# Patient Record
Sex: Female | Born: 1950 | Race: White | Hispanic: No | Smoking: Current every day smoker
Health system: Southern US, Community
[De-identification: ages and names within clinical notes are randomized; demographics above are authoritative.]

## PROBLEM LIST (undated history)

## (undated) DIAGNOSIS — J069 Acute upper respiratory infection, unspecified: Secondary | ICD-10-CM

## (undated) DIAGNOSIS — T8859XA Other complications of anesthesia, initial encounter: Secondary | ICD-10-CM

## (undated) DIAGNOSIS — I219 Acute myocardial infarction, unspecified: Secondary | ICD-10-CM

## (undated) DIAGNOSIS — G4730 Sleep apnea, unspecified: Secondary | ICD-10-CM

## (undated) DIAGNOSIS — K759 Inflammatory liver disease, unspecified: Secondary | ICD-10-CM

## (undated) DIAGNOSIS — M069 Rheumatoid arthritis, unspecified: Secondary | ICD-10-CM

## (undated) DIAGNOSIS — D649 Anemia, unspecified: Secondary | ICD-10-CM

## (undated) DIAGNOSIS — J189 Pneumonia, unspecified organism: Secondary | ICD-10-CM

## (undated) DIAGNOSIS — T4145XA Adverse effect of unspecified anesthetic, initial encounter: Secondary | ICD-10-CM

## (undated) DIAGNOSIS — J449 Chronic obstructive pulmonary disease, unspecified: Secondary | ICD-10-CM

## (undated) DIAGNOSIS — I2510 Atherosclerotic heart disease of native coronary artery without angina pectoris: Secondary | ICD-10-CM

## (undated) DIAGNOSIS — J42 Unspecified chronic bronchitis: Secondary | ICD-10-CM

## (undated) DIAGNOSIS — Z720 Tobacco use: Secondary | ICD-10-CM

## (undated) DIAGNOSIS — I4891 Unspecified atrial fibrillation: Secondary | ICD-10-CM

## (undated) DIAGNOSIS — E7800 Pure hypercholesterolemia, unspecified: Secondary | ICD-10-CM

## (undated) HISTORY — DX: Unspecified atrial fibrillation: I48.91

## (undated) HISTORY — PX: CORONARY ANGIOPLASTY: SHX604

## (undated) HISTORY — DX: Atherosclerotic heart disease of native coronary artery without angina pectoris: I25.10

## (undated) HISTORY — DX: Unspecified chronic bronchitis: J42

## (undated) HISTORY — PX: APPENDECTOMY: SHX54

## (undated) HISTORY — DX: Rheumatoid arthritis, unspecified: M06.9

## (undated) HISTORY — DX: Acute myocardial infarction, unspecified: I21.9

## (undated) HISTORY — DX: Acute upper respiratory infection, unspecified: J06.9

## (undated) HISTORY — PX: BREAST SURGERY: SHX581

## (undated) HISTORY — PX: ABDOMINAL HYSTERECTOMY: SHX81

## (undated) HISTORY — DX: Tobacco use: Z72.0

## (undated) HISTORY — PX: BACK SURGERY: SHX140

## (undated) HISTORY — DX: Pure hypercholesterolemia, unspecified: E78.00

## (undated) HISTORY — DX: Chronic obstructive pulmonary disease, unspecified: J44.9

---

## 1996-07-18 ENCOUNTER — Encounter (INDEPENDENT_AMBULATORY_CARE_PROVIDER_SITE_OTHER): Payer: Self-pay | Admitting: *Deleted

## 1997-11-07 ENCOUNTER — Encounter: Admission: RE | Admit: 1997-11-07 | Discharge: 1997-11-07 | Payer: Self-pay | Admitting: Family Medicine

## 1997-12-17 ENCOUNTER — Emergency Department (HOSPITAL_COMMUNITY): Admission: EM | Admit: 1997-12-17 | Discharge: 1997-12-17 | Payer: Self-pay | Admitting: Emergency Medicine

## 1997-12-24 ENCOUNTER — Emergency Department (HOSPITAL_COMMUNITY): Admission: EM | Admit: 1997-12-24 | Discharge: 1997-12-24 | Payer: Self-pay | Admitting: Emergency Medicine

## 1997-12-24 ENCOUNTER — Encounter: Admission: RE | Admit: 1997-12-24 | Discharge: 1997-12-24 | Payer: Self-pay | Admitting: Family Medicine

## 1998-01-21 ENCOUNTER — Emergency Department (HOSPITAL_COMMUNITY): Admission: EM | Admit: 1998-01-21 | Discharge: 1998-01-21 | Payer: Self-pay | Admitting: Emergency Medicine

## 1998-03-19 ENCOUNTER — Encounter: Admission: RE | Admit: 1998-03-19 | Discharge: 1998-03-19 | Payer: Self-pay | Admitting: Family Medicine

## 1998-04-03 ENCOUNTER — Encounter: Admission: RE | Admit: 1998-04-03 | Discharge: 1998-04-03 | Payer: Self-pay | Admitting: Family Medicine

## 1998-06-12 ENCOUNTER — Encounter: Payer: Self-pay | Admitting: Emergency Medicine

## 1998-06-12 ENCOUNTER — Emergency Department (HOSPITAL_COMMUNITY): Admission: EM | Admit: 1998-06-12 | Discharge: 1998-06-12 | Payer: Self-pay | Admitting: Emergency Medicine

## 1998-07-03 ENCOUNTER — Encounter: Admission: RE | Admit: 1998-07-03 | Discharge: 1998-07-03 | Payer: Self-pay | Admitting: Family Medicine

## 1998-08-13 ENCOUNTER — Encounter: Admission: RE | Admit: 1998-08-13 | Discharge: 1998-08-13 | Payer: Self-pay | Admitting: Family Medicine

## 1998-09-07 ENCOUNTER — Encounter: Admission: RE | Admit: 1998-09-07 | Discharge: 1998-09-07 | Payer: Self-pay | Admitting: Family Medicine

## 1998-10-15 ENCOUNTER — Encounter: Admission: RE | Admit: 1998-10-15 | Discharge: 1998-10-15 | Payer: Self-pay | Admitting: Family Medicine

## 1998-11-08 ENCOUNTER — Emergency Department (HOSPITAL_COMMUNITY): Admission: EM | Admit: 1998-11-08 | Discharge: 1998-11-08 | Payer: Self-pay | Admitting: *Deleted

## 1998-12-04 ENCOUNTER — Encounter: Admission: RE | Admit: 1998-12-04 | Discharge: 1998-12-04 | Payer: Self-pay | Admitting: Family Medicine

## 1998-12-07 ENCOUNTER — Encounter: Admission: RE | Admit: 1998-12-07 | Discharge: 1998-12-07 | Payer: Self-pay | Admitting: Family Medicine

## 1998-12-15 ENCOUNTER — Encounter: Admission: RE | Admit: 1998-12-15 | Discharge: 1998-12-15 | Payer: Self-pay | Admitting: *Deleted

## 1999-01-11 ENCOUNTER — Encounter: Admission: RE | Admit: 1999-01-11 | Discharge: 1999-01-11 | Payer: Self-pay | Admitting: Family Medicine

## 1999-01-27 ENCOUNTER — Encounter: Admission: RE | Admit: 1999-01-27 | Discharge: 1999-01-27 | Payer: Self-pay | Admitting: Family Medicine

## 1999-02-04 ENCOUNTER — Encounter: Admission: RE | Admit: 1999-02-04 | Discharge: 1999-02-04 | Payer: Self-pay | Admitting: Family Medicine

## 1999-02-08 ENCOUNTER — Encounter: Admission: RE | Admit: 1999-02-08 | Discharge: 1999-02-08 | Payer: Self-pay | Admitting: Family Medicine

## 1999-03-01 ENCOUNTER — Encounter: Admission: RE | Admit: 1999-03-01 | Discharge: 1999-03-01 | Payer: Self-pay | Admitting: Family Medicine

## 1999-03-10 ENCOUNTER — Encounter: Admission: RE | Admit: 1999-03-10 | Discharge: 1999-03-10 | Payer: Self-pay | Admitting: Family Medicine

## 1999-04-27 ENCOUNTER — Encounter: Admission: RE | Admit: 1999-04-27 | Discharge: 1999-04-27 | Payer: Self-pay | Admitting: Family Medicine

## 1999-04-28 ENCOUNTER — Encounter: Admission: RE | Admit: 1999-04-28 | Discharge: 1999-04-28 | Payer: Self-pay | Admitting: Family Medicine

## 1999-04-30 ENCOUNTER — Encounter: Admission: RE | Admit: 1999-04-30 | Discharge: 1999-04-30 | Payer: Self-pay | Admitting: Sports Medicine

## 1999-05-21 ENCOUNTER — Encounter: Admission: RE | Admit: 1999-05-21 | Discharge: 1999-05-21 | Payer: Self-pay | Admitting: Family Medicine

## 1999-05-24 ENCOUNTER — Encounter: Admission: RE | Admit: 1999-05-24 | Discharge: 1999-05-24 | Payer: Self-pay | Admitting: Sports Medicine

## 1999-05-24 ENCOUNTER — Encounter: Payer: Self-pay | Admitting: Sports Medicine

## 1999-06-02 ENCOUNTER — Encounter: Admission: RE | Admit: 1999-06-02 | Discharge: 1999-06-02 | Payer: Self-pay | Admitting: Family Medicine

## 1999-07-05 ENCOUNTER — Encounter: Admission: RE | Admit: 1999-07-05 | Discharge: 1999-07-05 | Payer: Self-pay | Admitting: Family Medicine

## 1999-07-10 ENCOUNTER — Emergency Department (HOSPITAL_COMMUNITY): Admission: EM | Admit: 1999-07-10 | Discharge: 1999-07-10 | Payer: Self-pay | Admitting: Emergency Medicine

## 1999-08-25 ENCOUNTER — Encounter: Admission: RE | Admit: 1999-08-25 | Discharge: 1999-08-25 | Payer: Self-pay | Admitting: Family Medicine

## 1999-09-22 ENCOUNTER — Encounter: Admission: RE | Admit: 1999-09-22 | Discharge: 1999-09-22 | Payer: Self-pay | Admitting: Family Medicine

## 1999-12-24 ENCOUNTER — Encounter: Admission: RE | Admit: 1999-12-24 | Discharge: 1999-12-24 | Payer: Self-pay | Admitting: Family Medicine

## 2000-01-05 ENCOUNTER — Encounter: Admission: RE | Admit: 2000-01-05 | Discharge: 2000-01-05 | Payer: Self-pay | Admitting: Family Medicine

## 2000-03-12 ENCOUNTER — Encounter: Payer: Self-pay | Admitting: *Deleted

## 2000-03-12 ENCOUNTER — Inpatient Hospital Stay (HOSPITAL_COMMUNITY): Admission: EM | Admit: 2000-03-12 | Discharge: 2000-03-15 | Payer: Self-pay | Admitting: *Deleted

## 2000-03-13 ENCOUNTER — Encounter: Payer: Self-pay | Admitting: *Deleted

## 2000-03-14 ENCOUNTER — Encounter: Payer: Self-pay | Admitting: Cardiology

## 2000-04-18 ENCOUNTER — Encounter: Admission: RE | Admit: 2000-04-18 | Discharge: 2000-04-18 | Payer: Self-pay | Admitting: Family Medicine

## 2000-05-03 ENCOUNTER — Encounter: Admission: RE | Admit: 2000-05-03 | Discharge: 2000-05-03 | Payer: Self-pay | Admitting: Family Medicine

## 2000-06-03 ENCOUNTER — Emergency Department (HOSPITAL_COMMUNITY): Admission: EM | Admit: 2000-06-03 | Discharge: 2000-06-03 | Payer: Self-pay | Admitting: Emergency Medicine

## 2000-06-03 ENCOUNTER — Encounter: Payer: Self-pay | Admitting: Emergency Medicine

## 2000-06-06 ENCOUNTER — Encounter: Admission: RE | Admit: 2000-06-06 | Discharge: 2000-06-06 | Payer: Self-pay | Admitting: Family Medicine

## 2000-08-28 ENCOUNTER — Encounter: Admission: RE | Admit: 2000-08-28 | Discharge: 2000-08-28 | Payer: Self-pay | Admitting: Family Medicine

## 2000-09-05 ENCOUNTER — Encounter: Admission: RE | Admit: 2000-09-05 | Discharge: 2000-09-05 | Payer: Self-pay | Admitting: Sports Medicine

## 2000-09-11 ENCOUNTER — Encounter: Admission: RE | Admit: 2000-09-11 | Discharge: 2000-09-11 | Payer: Self-pay | Admitting: Family Medicine

## 2000-09-15 ENCOUNTER — Encounter: Admission: RE | Admit: 2000-09-15 | Discharge: 2000-09-15 | Payer: Self-pay | Admitting: *Deleted

## 2000-10-11 ENCOUNTER — Encounter: Admission: RE | Admit: 2000-10-11 | Discharge: 2000-10-11 | Payer: Self-pay | Admitting: Family Medicine

## 2000-11-23 ENCOUNTER — Encounter: Admission: RE | Admit: 2000-11-23 | Discharge: 2000-11-23 | Payer: Self-pay | Admitting: Family Medicine

## 2000-12-19 ENCOUNTER — Emergency Department (HOSPITAL_COMMUNITY): Admission: EM | Admit: 2000-12-19 | Discharge: 2000-12-19 | Payer: Self-pay | Admitting: *Deleted

## 2001-02-28 ENCOUNTER — Encounter: Admission: RE | Admit: 2001-02-28 | Discharge: 2001-02-28 | Payer: Self-pay | Admitting: Family Medicine

## 2001-03-23 ENCOUNTER — Emergency Department (HOSPITAL_COMMUNITY): Admission: EM | Admit: 2001-03-23 | Discharge: 2001-03-24 | Payer: Self-pay | Admitting: Emergency Medicine

## 2001-04-05 ENCOUNTER — Encounter: Admission: RE | Admit: 2001-04-05 | Discharge: 2001-04-05 | Payer: Self-pay | Admitting: Family Medicine

## 2001-04-10 ENCOUNTER — Emergency Department (HOSPITAL_COMMUNITY): Admission: EM | Admit: 2001-04-10 | Discharge: 2001-04-11 | Payer: Self-pay | Admitting: Emergency Medicine

## 2001-04-10 ENCOUNTER — Encounter: Payer: Self-pay | Admitting: Emergency Medicine

## 2001-09-24 ENCOUNTER — Encounter: Admission: RE | Admit: 2001-09-24 | Discharge: 2001-09-24 | Payer: Self-pay | Admitting: Family Medicine

## 2001-11-08 ENCOUNTER — Encounter: Admission: RE | Admit: 2001-11-08 | Discharge: 2001-11-08 | Payer: Self-pay | Admitting: Sports Medicine

## 2002-04-05 ENCOUNTER — Encounter: Admission: RE | Admit: 2002-04-05 | Discharge: 2002-04-05 | Payer: Self-pay | Admitting: Family Medicine

## 2002-04-26 ENCOUNTER — Encounter: Admission: RE | Admit: 2002-04-26 | Discharge: 2002-04-26 | Payer: Self-pay | Admitting: Family Medicine

## 2002-05-03 ENCOUNTER — Emergency Department (HOSPITAL_COMMUNITY): Admission: EM | Admit: 2002-05-03 | Discharge: 2002-05-03 | Payer: Self-pay | Admitting: Emergency Medicine

## 2002-12-22 ENCOUNTER — Emergency Department (HOSPITAL_COMMUNITY): Admission: EM | Admit: 2002-12-22 | Discharge: 2002-12-22 | Payer: Self-pay | Admitting: Emergency Medicine

## 2002-12-23 ENCOUNTER — Encounter: Admission: RE | Admit: 2002-12-23 | Discharge: 2002-12-23 | Payer: Self-pay | Admitting: Sports Medicine

## 2003-03-10 ENCOUNTER — Emergency Department (HOSPITAL_COMMUNITY): Admission: EM | Admit: 2003-03-10 | Discharge: 2003-03-10 | Payer: Self-pay | Admitting: Emergency Medicine

## 2003-05-07 ENCOUNTER — Encounter: Admission: RE | Admit: 2003-05-07 | Discharge: 2003-05-07 | Payer: Self-pay | Admitting: Family Medicine

## 2003-06-18 ENCOUNTER — Encounter: Admission: RE | Admit: 2003-06-18 | Discharge: 2003-06-18 | Payer: Self-pay | Admitting: Family Medicine

## 2003-07-22 ENCOUNTER — Emergency Department (HOSPITAL_COMMUNITY): Admission: EM | Admit: 2003-07-22 | Discharge: 2003-07-22 | Payer: Self-pay | Admitting: Emergency Medicine

## 2003-08-20 ENCOUNTER — Emergency Department (HOSPITAL_COMMUNITY): Admission: EM | Admit: 2003-08-20 | Discharge: 2003-08-20 | Payer: Self-pay | Admitting: Emergency Medicine

## 2004-02-02 ENCOUNTER — Encounter: Admission: RE | Admit: 2004-02-02 | Discharge: 2004-02-02 | Payer: Self-pay | Admitting: Family Medicine

## 2004-04-23 ENCOUNTER — Ambulatory Visit: Payer: Self-pay | Admitting: Sports Medicine

## 2004-04-27 ENCOUNTER — Ambulatory Visit: Payer: Self-pay | Admitting: Family Medicine

## 2004-04-27 ENCOUNTER — Observation Stay (HOSPITAL_COMMUNITY): Admission: EM | Admit: 2004-04-27 | Discharge: 2004-04-28 | Payer: Self-pay | Admitting: Family Medicine

## 2004-05-03 ENCOUNTER — Ambulatory Visit: Payer: Self-pay | Admitting: Sports Medicine

## 2004-06-19 ENCOUNTER — Emergency Department (HOSPITAL_COMMUNITY): Admission: EM | Admit: 2004-06-19 | Discharge: 2004-06-19 | Payer: Self-pay | Admitting: Emergency Medicine

## 2004-07-18 DIAGNOSIS — I219 Acute myocardial infarction, unspecified: Secondary | ICD-10-CM

## 2004-07-18 HISTORY — DX: Acute myocardial infarction, unspecified: I21.9

## 2004-08-08 ENCOUNTER — Emergency Department (HOSPITAL_COMMUNITY): Admission: EM | Admit: 2004-08-08 | Discharge: 2004-08-08 | Payer: Self-pay | Admitting: Emergency Medicine

## 2004-09-14 ENCOUNTER — Ambulatory Visit: Payer: Self-pay | Admitting: Sports Medicine

## 2004-09-29 ENCOUNTER — Ambulatory Visit: Payer: Self-pay | Admitting: Family Medicine

## 2004-10-10 ENCOUNTER — Emergency Department (HOSPITAL_COMMUNITY): Admission: EM | Admit: 2004-10-10 | Discharge: 2004-10-10 | Payer: Self-pay | Admitting: Emergency Medicine

## 2004-10-30 ENCOUNTER — Emergency Department (HOSPITAL_COMMUNITY): Admission: EM | Admit: 2004-10-30 | Discharge: 2004-10-30 | Payer: Self-pay | Admitting: Emergency Medicine

## 2004-11-05 ENCOUNTER — Emergency Department (HOSPITAL_COMMUNITY): Admission: EM | Admit: 2004-11-05 | Discharge: 2004-11-05 | Payer: Self-pay | Admitting: Emergency Medicine

## 2004-11-08 ENCOUNTER — Encounter: Admission: RE | Admit: 2004-11-08 | Discharge: 2004-11-08 | Payer: Self-pay | Admitting: Family Medicine

## 2004-11-08 ENCOUNTER — Ambulatory Visit: Payer: Self-pay | Admitting: Family Medicine

## 2004-11-17 ENCOUNTER — Ambulatory Visit: Payer: Self-pay | Admitting: Family Medicine

## 2004-11-23 ENCOUNTER — Ambulatory Visit: Payer: Self-pay | Admitting: Sports Medicine

## 2004-11-29 ENCOUNTER — Ambulatory Visit: Payer: Self-pay | Admitting: Sports Medicine

## 2004-11-29 ENCOUNTER — Ambulatory Visit (HOSPITAL_COMMUNITY): Admission: RE | Admit: 2004-11-29 | Discharge: 2004-11-29 | Payer: Self-pay | Admitting: Sports Medicine

## 2004-11-29 ENCOUNTER — Ambulatory Visit: Payer: Self-pay | Admitting: Family Medicine

## 2004-11-29 ENCOUNTER — Inpatient Hospital Stay (HOSPITAL_COMMUNITY): Admission: AD | Admit: 2004-11-29 | Discharge: 2004-12-01 | Payer: Self-pay | Admitting: Sports Medicine

## 2004-11-30 ENCOUNTER — Encounter (INDEPENDENT_AMBULATORY_CARE_PROVIDER_SITE_OTHER): Payer: Self-pay | Admitting: *Deleted

## 2004-12-06 ENCOUNTER — Ambulatory Visit: Payer: Self-pay | Admitting: Family Medicine

## 2004-12-14 ENCOUNTER — Encounter: Payer: Self-pay | Admitting: Interventional Radiology

## 2004-12-16 ENCOUNTER — Ambulatory Visit: Payer: Self-pay | Admitting: Family Medicine

## 2004-12-21 ENCOUNTER — Encounter: Payer: Self-pay | Admitting: Interventional Radiology

## 2005-01-04 ENCOUNTER — Emergency Department (HOSPITAL_COMMUNITY): Admission: EM | Admit: 2005-01-04 | Discharge: 2005-01-04 | Payer: Self-pay | Admitting: Emergency Medicine

## 2005-01-24 ENCOUNTER — Ambulatory Visit: Payer: Self-pay | Admitting: Sports Medicine

## 2005-01-31 ENCOUNTER — Encounter: Payer: Self-pay | Admitting: Interventional Radiology

## 2005-02-03 ENCOUNTER — Ambulatory Visit (HOSPITAL_COMMUNITY): Admission: RE | Admit: 2005-02-03 | Discharge: 2005-02-03 | Payer: Self-pay | Admitting: Interventional Radiology

## 2005-02-11 ENCOUNTER — Ambulatory Visit (HOSPITAL_COMMUNITY): Admission: RE | Admit: 2005-02-11 | Discharge: 2005-02-11 | Payer: Self-pay | Admitting: Interventional Radiology

## 2005-02-11 ENCOUNTER — Encounter (INDEPENDENT_AMBULATORY_CARE_PROVIDER_SITE_OTHER): Payer: Self-pay | Admitting: *Deleted

## 2005-02-21 ENCOUNTER — Ambulatory Visit: Payer: Self-pay | Admitting: Sports Medicine

## 2005-02-25 ENCOUNTER — Ambulatory Visit: Payer: Self-pay | Admitting: Family Medicine

## 2005-02-25 ENCOUNTER — Encounter: Payer: Self-pay | Admitting: Interventional Radiology

## 2005-03-27 ENCOUNTER — Emergency Department (HOSPITAL_COMMUNITY): Admission: EM | Admit: 2005-03-27 | Discharge: 2005-03-27 | Payer: Self-pay | Admitting: Emergency Medicine

## 2005-03-29 ENCOUNTER — Inpatient Hospital Stay (HOSPITAL_COMMUNITY): Admission: EM | Admit: 2005-03-29 | Discharge: 2005-04-03 | Payer: Self-pay | Admitting: Emergency Medicine

## 2005-03-29 ENCOUNTER — Ambulatory Visit: Payer: Self-pay | Admitting: Sports Medicine

## 2005-03-30 ENCOUNTER — Encounter (INDEPENDENT_AMBULATORY_CARE_PROVIDER_SITE_OTHER): Payer: Self-pay | Admitting: Cardiology

## 2005-04-15 ENCOUNTER — Ambulatory Visit: Payer: Self-pay | Admitting: Family Medicine

## 2005-04-19 ENCOUNTER — Ambulatory Visit (HOSPITAL_COMMUNITY): Admission: RE | Admit: 2005-04-19 | Discharge: 2005-04-19 | Payer: Self-pay | Admitting: Family Medicine

## 2005-05-02 ENCOUNTER — Emergency Department (HOSPITAL_COMMUNITY): Admission: EM | Admit: 2005-05-02 | Discharge: 2005-05-02 | Payer: Self-pay | Admitting: Emergency Medicine

## 2005-05-03 ENCOUNTER — Ambulatory Visit: Payer: Self-pay | Admitting: Family Medicine

## 2005-05-11 ENCOUNTER — Emergency Department (HOSPITAL_COMMUNITY): Admission: EM | Admit: 2005-05-11 | Discharge: 2005-05-12 | Payer: Self-pay | Admitting: Emergency Medicine

## 2005-06-06 ENCOUNTER — Ambulatory Visit: Payer: Self-pay | Admitting: Family Medicine

## 2005-06-29 ENCOUNTER — Ambulatory Visit: Payer: Self-pay | Admitting: Family Medicine

## 2005-07-01 ENCOUNTER — Encounter: Admission: RE | Admit: 2005-07-01 | Discharge: 2005-07-01 | Payer: Self-pay | Admitting: Family Medicine

## 2005-07-18 HISTORY — PX: OTHER SURGICAL HISTORY: SHX169

## 2005-07-22 ENCOUNTER — Ambulatory Visit: Payer: Self-pay | Admitting: Family Medicine

## 2005-08-17 ENCOUNTER — Ambulatory Visit: Payer: Self-pay | Admitting: Family Medicine

## 2005-08-24 ENCOUNTER — Ambulatory Visit: Payer: Self-pay | Admitting: Family Medicine

## 2005-08-26 ENCOUNTER — Ambulatory Visit: Payer: Self-pay | Admitting: Physical Medicine and Rehabilitation

## 2005-08-26 ENCOUNTER — Encounter
Admission: RE | Admit: 2005-08-26 | Discharge: 2005-11-24 | Payer: Self-pay | Admitting: Physical Medicine and Rehabilitation

## 2005-09-29 ENCOUNTER — Ambulatory Visit: Payer: Self-pay | Admitting: Family Medicine

## 2005-09-29 ENCOUNTER — Ambulatory Visit (HOSPITAL_COMMUNITY)
Admission: RE | Admit: 2005-09-29 | Discharge: 2005-09-29 | Payer: Self-pay | Admitting: Physical Medicine and Rehabilitation

## 2005-10-19 ENCOUNTER — Ambulatory Visit: Payer: Self-pay | Admitting: Physical Medicine and Rehabilitation

## 2005-11-18 ENCOUNTER — Encounter
Admission: RE | Admit: 2005-11-18 | Discharge: 2006-02-16 | Payer: Self-pay | Admitting: Physical Medicine and Rehabilitation

## 2005-12-14 ENCOUNTER — Ambulatory Visit: Payer: Self-pay | Admitting: Family Medicine

## 2005-12-15 ENCOUNTER — Encounter
Admission: RE | Admit: 2005-12-15 | Discharge: 2005-12-15 | Payer: Self-pay | Admitting: Physical Medicine and Rehabilitation

## 2005-12-16 ENCOUNTER — Ambulatory Visit: Payer: Self-pay | Admitting: Physical Medicine and Rehabilitation

## 2006-02-10 ENCOUNTER — Encounter
Admission: RE | Admit: 2006-02-10 | Discharge: 2006-05-11 | Payer: Self-pay | Admitting: Physical Medicine and Rehabilitation

## 2006-02-10 ENCOUNTER — Ambulatory Visit: Payer: Self-pay | Admitting: Physical Medicine and Rehabilitation

## 2006-03-15 ENCOUNTER — Ambulatory Visit: Payer: Self-pay | Admitting: Family Medicine

## 2006-04-06 ENCOUNTER — Ambulatory Visit: Payer: Self-pay | Admitting: Physical Medicine & Rehabilitation

## 2006-05-05 ENCOUNTER — Ambulatory Visit: Payer: Self-pay | Admitting: Physical Medicine and Rehabilitation

## 2006-05-13 ENCOUNTER — Emergency Department (HOSPITAL_COMMUNITY): Admission: EM | Admit: 2006-05-13 | Discharge: 2006-05-13 | Payer: Self-pay | Admitting: Emergency Medicine

## 2006-06-02 ENCOUNTER — Encounter
Admission: RE | Admit: 2006-06-02 | Discharge: 2006-08-31 | Payer: Self-pay | Admitting: Physical Medicine and Rehabilitation

## 2006-06-28 ENCOUNTER — Ambulatory Visit: Payer: Self-pay | Admitting: Physical Medicine and Rehabilitation

## 2006-08-01 ENCOUNTER — Ambulatory Visit: Payer: Self-pay | Admitting: Physical Medicine and Rehabilitation

## 2006-08-29 ENCOUNTER — Encounter
Admission: RE | Admit: 2006-08-29 | Discharge: 2006-11-27 | Payer: Self-pay | Admitting: Physical Medicine and Rehabilitation

## 2006-08-29 ENCOUNTER — Ambulatory Visit: Payer: Self-pay | Admitting: Physical Medicine and Rehabilitation

## 2006-09-14 DIAGNOSIS — K219 Gastro-esophageal reflux disease without esophagitis: Secondary | ICD-10-CM

## 2006-09-14 DIAGNOSIS — IMO0002 Reserved for concepts with insufficient information to code with codable children: Secondary | ICD-10-CM | POA: Insufficient documentation

## 2006-09-14 DIAGNOSIS — I2510 Atherosclerotic heart disease of native coronary artery without angina pectoris: Secondary | ICD-10-CM | POA: Insufficient documentation

## 2006-09-14 DIAGNOSIS — M810 Age-related osteoporosis without current pathological fracture: Secondary | ICD-10-CM | POA: Insufficient documentation

## 2006-09-14 DIAGNOSIS — E1165 Type 2 diabetes mellitus with hyperglycemia: Secondary | ICD-10-CM

## 2006-09-14 DIAGNOSIS — E7800 Pure hypercholesterolemia, unspecified: Secondary | ICD-10-CM | POA: Insufficient documentation

## 2006-09-14 DIAGNOSIS — M1990 Unspecified osteoarthritis, unspecified site: Secondary | ICD-10-CM

## 2006-09-14 DIAGNOSIS — I4891 Unspecified atrial fibrillation: Secondary | ICD-10-CM | POA: Insufficient documentation

## 2006-09-15 ENCOUNTER — Encounter (INDEPENDENT_AMBULATORY_CARE_PROVIDER_SITE_OTHER): Payer: Self-pay | Admitting: *Deleted

## 2006-10-17 ENCOUNTER — Encounter (INDEPENDENT_AMBULATORY_CARE_PROVIDER_SITE_OTHER): Payer: Self-pay | Admitting: Family Medicine

## 2006-10-17 ENCOUNTER — Ambulatory Visit: Payer: Self-pay | Admitting: Sports Medicine

## 2006-10-17 DIAGNOSIS — E785 Hyperlipidemia, unspecified: Secondary | ICD-10-CM

## 2006-10-17 DIAGNOSIS — M069 Rheumatoid arthritis, unspecified: Secondary | ICD-10-CM | POA: Insufficient documentation

## 2006-10-17 DIAGNOSIS — E1165 Type 2 diabetes mellitus with hyperglycemia: Secondary | ICD-10-CM

## 2006-10-17 DIAGNOSIS — I10 Essential (primary) hypertension: Secondary | ICD-10-CM | POA: Insufficient documentation

## 2006-10-17 DIAGNOSIS — F17200 Nicotine dependence, unspecified, uncomplicated: Secondary | ICD-10-CM

## 2006-10-17 LAB — CONVERTED CEMR LAB
ALT: 24 units/L (ref 0–35)
AST: 23 units/L (ref 0–37)
Albumin: 4.1 g/dL (ref 3.5–5.2)
BUN: 7 mg/dL (ref 6–23)
CO2: 23 meq/L (ref 19–32)
Calcium: 9.4 mg/dL (ref 8.4–10.5)
Chloride: 98 meq/L (ref 96–112)
Hgb A1c MFr Bld: 13.1 %
Phosphorus: 3.7 mg/dL (ref 2.3–4.6)
Potassium: 4 meq/L (ref 3.5–5.3)

## 2006-10-20 ENCOUNTER — Ambulatory Visit: Payer: Self-pay | Admitting: Physical Medicine and Rehabilitation

## 2006-10-24 ENCOUNTER — Telehealth (INDEPENDENT_AMBULATORY_CARE_PROVIDER_SITE_OTHER): Payer: Self-pay | Admitting: *Deleted

## 2006-10-27 ENCOUNTER — Telehealth (INDEPENDENT_AMBULATORY_CARE_PROVIDER_SITE_OTHER): Payer: Self-pay | Admitting: Family Medicine

## 2006-11-03 ENCOUNTER — Encounter: Payer: Self-pay | Admitting: *Deleted

## 2006-11-20 ENCOUNTER — Ambulatory Visit: Payer: Self-pay | Admitting: Physical Medicine and Rehabilitation

## 2006-12-20 ENCOUNTER — Encounter
Admission: RE | Admit: 2006-12-20 | Discharge: 2007-03-20 | Payer: Self-pay | Admitting: Physical Medicine and Rehabilitation

## 2006-12-20 ENCOUNTER — Ambulatory Visit: Payer: Self-pay | Admitting: Physical Medicine and Rehabilitation

## 2007-01-22 ENCOUNTER — Ambulatory Visit: Payer: Self-pay | Admitting: Physical Medicine and Rehabilitation

## 2007-02-12 ENCOUNTER — Telehealth (INDEPENDENT_AMBULATORY_CARE_PROVIDER_SITE_OTHER): Payer: Self-pay | Admitting: Family Medicine

## 2007-02-13 ENCOUNTER — Telehealth: Payer: Self-pay | Admitting: *Deleted

## 2007-02-13 ENCOUNTER — Ambulatory Visit: Payer: Self-pay | Admitting: Family Medicine

## 2007-02-13 LAB — CONVERTED CEMR LAB
Bilirubin Urine: NEGATIVE
Ketones, urine, test strip: NEGATIVE
Nitrite: NEGATIVE
Urobilinogen, UA: 0.2

## 2007-02-20 ENCOUNTER — Ambulatory Visit: Payer: Self-pay | Admitting: Physical Medicine and Rehabilitation

## 2007-03-19 IMAGING — CR DG LUMBAR SPINE COMPLETE 4+V
5 series · 5 of 5 positions shown · non-contrast
Comparison: None.

CLINICAL DATA: Two week history of pain.  
 FOUR VIEW LUMBAR SPINE, COMPLETE ? 11/08/04:

[view not recorded (1 of 5)]
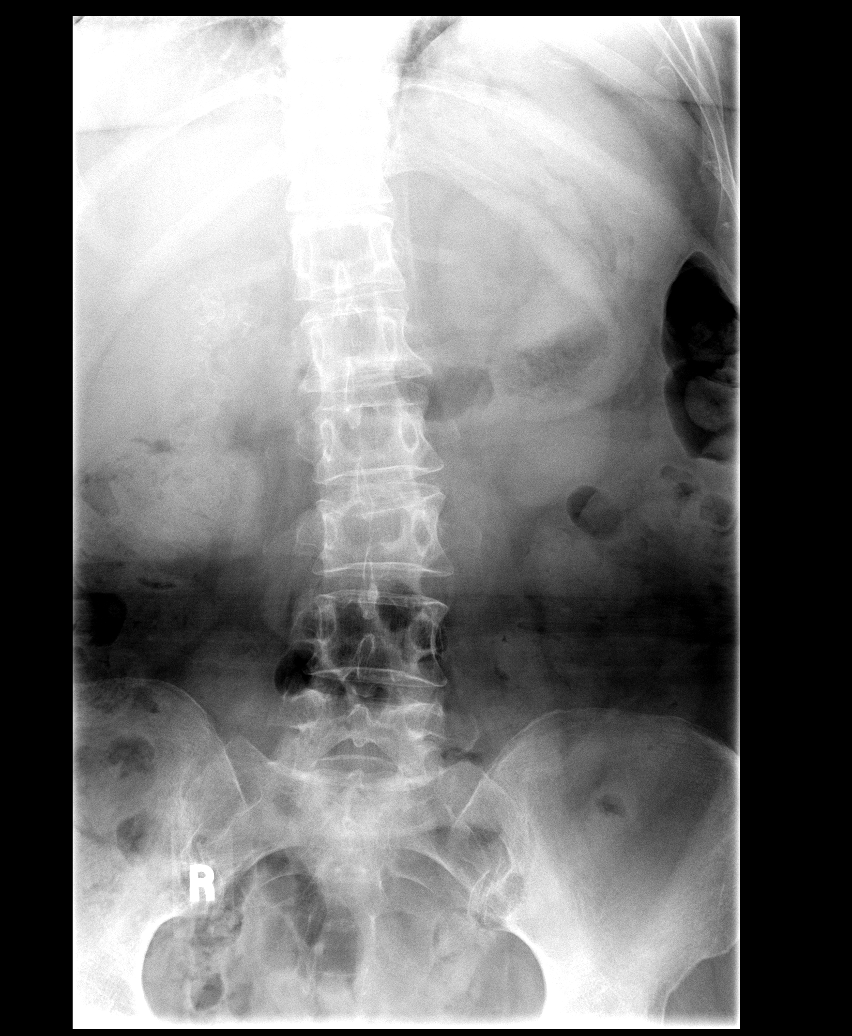

[view not recorded (2 of 5)]
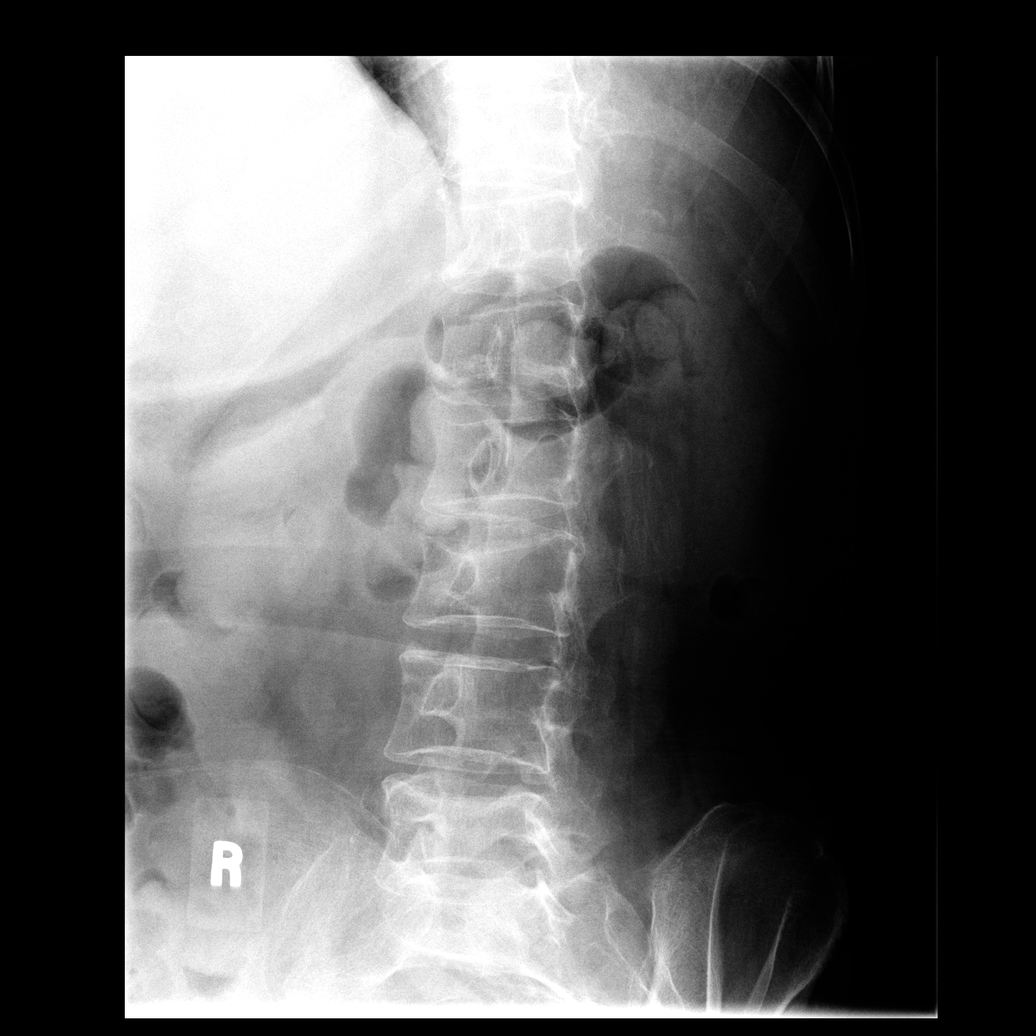

[view not recorded (3 of 5)]
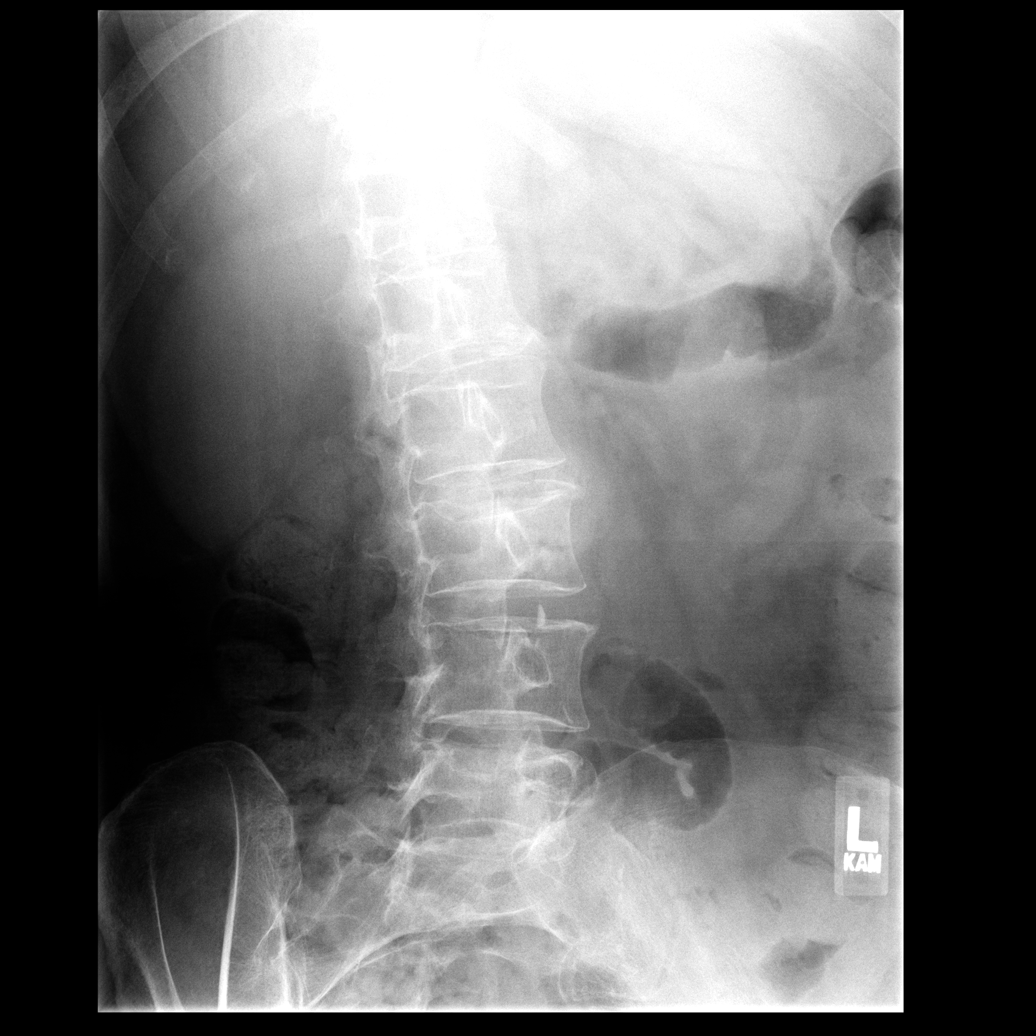

[view not recorded (4 of 5)]
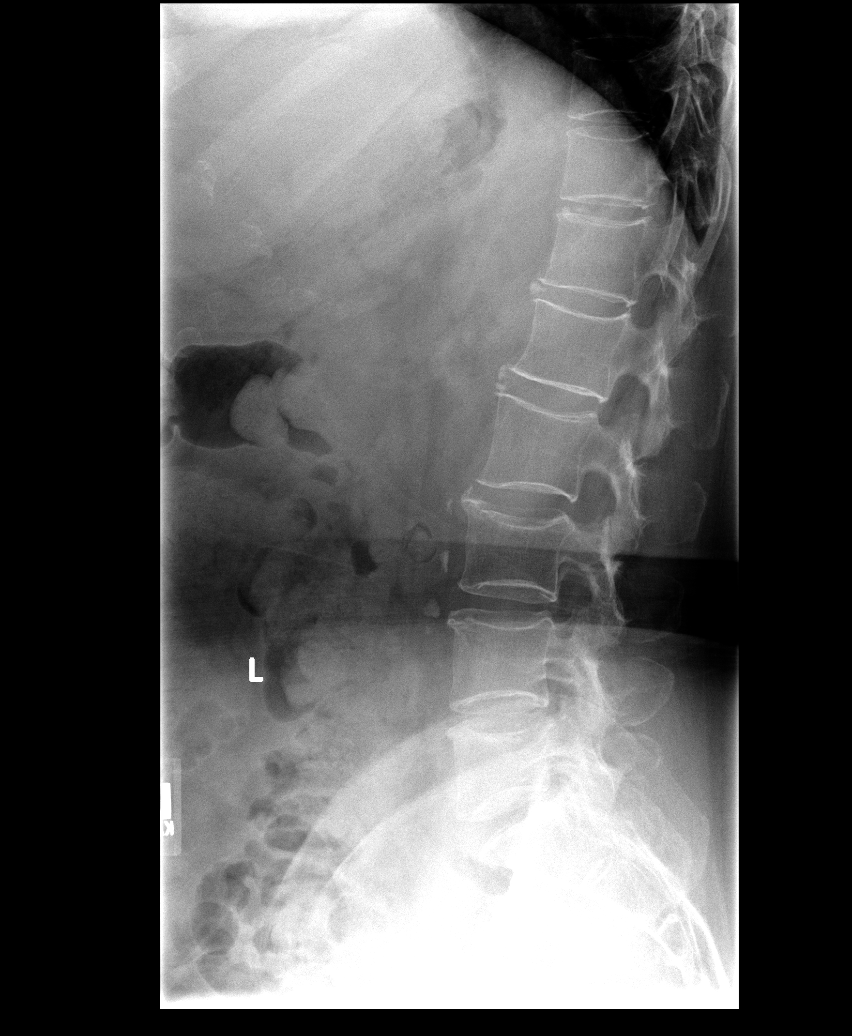

[view not recorded (5 of 5)]
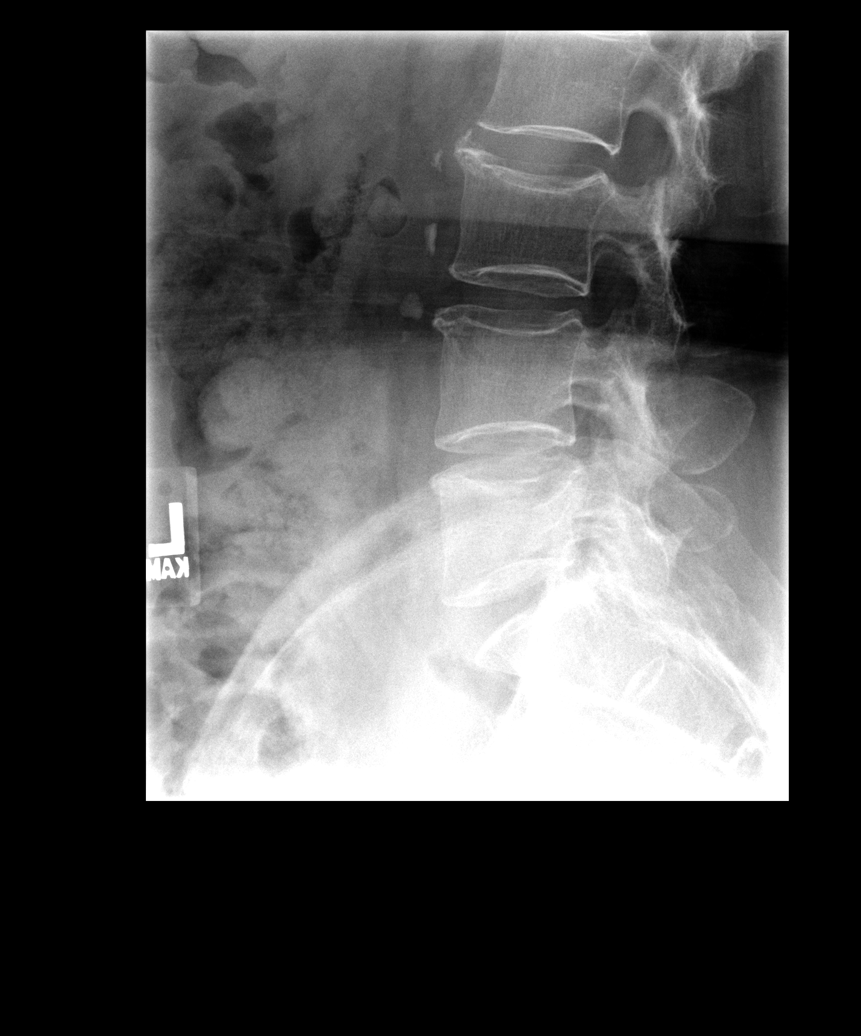

[5 of 5 positions shown; findings below may reference images not displayed]

Five non-rib-bearing lumbar vertebral bodies with mild convex leftward scoliosis.  There is superior end plate compression at the L3 level without substantial loss of height (less than 25%).  No retropulsed fragments apparent by plain film exam.  
 Loss of disk height noted at L4-5.  The facets are well aligned bilaterally.
 Incidental note of multiple calcified gallstones.
IMPRESSION: 1.  L3 superior end plate fracture.  No evidence for retropulsed fragments by plain film.  A CT would be a more sensitive means to investigate if clinically indicated.
 2.  Cholelithiasis.

## 2007-03-21 ENCOUNTER — Encounter
Admission: RE | Admit: 2007-03-21 | Discharge: 2007-06-19 | Payer: Self-pay | Admitting: Physical Medicine and Rehabilitation

## 2007-03-21 ENCOUNTER — Encounter (INDEPENDENT_AMBULATORY_CARE_PROVIDER_SITE_OTHER): Payer: Self-pay | Admitting: *Deleted

## 2007-04-12 ENCOUNTER — Telehealth: Payer: Self-pay | Admitting: Family Medicine

## 2007-04-12 ENCOUNTER — Encounter: Payer: Self-pay | Admitting: Family Medicine

## 2007-04-12 ENCOUNTER — Ambulatory Visit: Payer: Self-pay | Admitting: Family Medicine

## 2007-04-12 LAB — CONVERTED CEMR LAB
ALT: 15 units/L (ref 0–35)
AST: 14 units/L (ref 0–37)
Albumin: 3.7 g/dL (ref 3.5–5.2)
Alkaline Phosphatase: 84 units/L (ref 39–117)
BUN: 7 mg/dL (ref 6–23)
Hgb A1c MFr Bld: 10.1 %
INR: 2.9
MCHC: 34.3 g/dL (ref 30.0–36.0)
Platelets: 213 10*3/uL (ref 150–400)
Potassium: 4.3 meq/L (ref 3.5–5.3)
RDW: 14.4 % — ABNORMAL HIGH (ref 11.5–14.0)
Sodium: 137 meq/L (ref 135–145)
Total Protein: 6.8 g/dL (ref 6.0–8.3)

## 2007-04-13 ENCOUNTER — Ambulatory Visit: Payer: Self-pay | Admitting: Physical Medicine and Rehabilitation

## 2007-04-27 ENCOUNTER — Ambulatory Visit (HOSPITAL_COMMUNITY): Admission: RE | Admit: 2007-04-27 | Discharge: 2007-04-27 | Payer: Self-pay | Admitting: Interventional Radiology

## 2007-05-11 ENCOUNTER — Encounter: Payer: Self-pay | Admitting: *Deleted

## 2007-05-17 ENCOUNTER — Ambulatory Visit: Payer: Self-pay | Admitting: Physical Medicine and Rehabilitation

## 2007-05-29 ENCOUNTER — Encounter
Admission: RE | Admit: 2007-05-29 | Discharge: 2007-06-23 | Payer: Self-pay | Admitting: Physical Medicine and Rehabilitation

## 2007-06-04 ENCOUNTER — Encounter: Payer: Self-pay | Admitting: Family Medicine

## 2007-06-12 ENCOUNTER — Encounter
Admission: RE | Admit: 2007-06-12 | Discharge: 2007-09-10 | Payer: Self-pay | Admitting: Physical Medicine and Rehabilitation

## 2007-06-12 ENCOUNTER — Ambulatory Visit: Payer: Self-pay | Admitting: Physical Medicine and Rehabilitation

## 2007-06-26 ENCOUNTER — Ambulatory Visit: Payer: Self-pay | Admitting: Family Medicine

## 2007-06-26 LAB — CONVERTED CEMR LAB: INR: 1.6

## 2007-06-29 ENCOUNTER — Telehealth: Payer: Self-pay | Admitting: *Deleted

## 2007-08-05 ENCOUNTER — Emergency Department (HOSPITAL_COMMUNITY): Admission: EM | Admit: 2007-08-05 | Discharge: 2007-08-05 | Payer: Self-pay | Admitting: Emergency Medicine

## 2007-08-14 ENCOUNTER — Encounter
Admission: RE | Admit: 2007-08-14 | Discharge: 2007-11-12 | Payer: Self-pay | Admitting: Physical Medicine and Rehabilitation

## 2007-08-14 ENCOUNTER — Ambulatory Visit: Payer: Self-pay | Admitting: Physical Medicine and Rehabilitation

## 2007-09-14 ENCOUNTER — Encounter: Payer: Self-pay | Admitting: Family Medicine

## 2007-09-21 ENCOUNTER — Telehealth (INDEPENDENT_AMBULATORY_CARE_PROVIDER_SITE_OTHER): Payer: Self-pay | Admitting: *Deleted

## 2007-10-02 ENCOUNTER — Ambulatory Visit: Payer: Self-pay | Admitting: Family Medicine

## 2007-10-11 ENCOUNTER — Ambulatory Visit: Payer: Self-pay | Admitting: Physical Medicine and Rehabilitation

## 2007-10-18 ENCOUNTER — Telehealth: Payer: Self-pay | Admitting: *Deleted

## 2007-10-18 ENCOUNTER — Encounter: Payer: Self-pay | Admitting: Family Medicine

## 2007-10-19 ENCOUNTER — Encounter: Payer: Self-pay | Admitting: Family Medicine

## 2007-10-24 ENCOUNTER — Telehealth: Payer: Self-pay | Admitting: *Deleted

## 2007-11-08 ENCOUNTER — Encounter
Admission: RE | Admit: 2007-11-08 | Discharge: 2008-01-11 | Payer: Self-pay | Admitting: Physical Medicine and Rehabilitation

## 2007-11-12 ENCOUNTER — Ambulatory Visit: Payer: Self-pay | Admitting: Physical Medicine and Rehabilitation

## 2007-11-13 ENCOUNTER — Encounter: Payer: Self-pay | Admitting: Family Medicine

## 2007-11-29 ENCOUNTER — Telehealth: Payer: Self-pay | Admitting: Family Medicine

## 2007-12-12 ENCOUNTER — Ambulatory Visit: Payer: Self-pay | Admitting: Physical Medicine and Rehabilitation

## 2007-12-14 ENCOUNTER — Ambulatory Visit: Payer: Self-pay | Admitting: Family Medicine

## 2007-12-14 ENCOUNTER — Encounter: Payer: Self-pay | Admitting: Family Medicine

## 2007-12-14 LAB — CONVERTED CEMR LAB
AST: 17 units/L (ref 0–37)
Albumin: 4 g/dL (ref 3.5–5.2)
Alkaline Phosphatase: 73 units/L (ref 39–117)
Glucose, Bld: 301 mg/dL — ABNORMAL HIGH (ref 70–99)
Potassium: 5.2 meq/L (ref 3.5–5.3)
Sodium: 140 meq/L (ref 135–145)
Total Protein: 7.1 g/dL (ref 6.0–8.3)

## 2007-12-24 ENCOUNTER — Telehealth: Payer: Self-pay | Admitting: Family Medicine

## 2007-12-26 ENCOUNTER — Telehealth (INDEPENDENT_AMBULATORY_CARE_PROVIDER_SITE_OTHER): Payer: Self-pay | Admitting: *Deleted

## 2008-01-02 ENCOUNTER — Ambulatory Visit: Payer: Self-pay | Admitting: Family Medicine

## 2008-01-11 ENCOUNTER — Ambulatory Visit: Payer: Self-pay | Admitting: Physical Medicine and Rehabilitation

## 2008-01-30 ENCOUNTER — Encounter: Payer: Self-pay | Admitting: Family Medicine

## 2008-01-31 ENCOUNTER — Encounter (INDEPENDENT_AMBULATORY_CARE_PROVIDER_SITE_OTHER): Payer: Self-pay | Admitting: *Deleted

## 2008-02-05 ENCOUNTER — Encounter
Admission: RE | Admit: 2008-02-05 | Discharge: 2008-04-04 | Payer: Self-pay | Admitting: Physical Medicine and Rehabilitation

## 2008-02-06 ENCOUNTER — Ambulatory Visit: Payer: Self-pay | Admitting: Physical Medicine and Rehabilitation

## 2008-03-05 ENCOUNTER — Ambulatory Visit: Payer: Self-pay | Admitting: Physical Medicine and Rehabilitation

## 2008-04-04 ENCOUNTER — Ambulatory Visit: Payer: Self-pay | Admitting: Physical Medicine and Rehabilitation

## 2008-04-22 ENCOUNTER — Ambulatory Visit: Payer: Self-pay | Admitting: Family Medicine

## 2008-04-22 ENCOUNTER — Ambulatory Visit (HOSPITAL_COMMUNITY): Admission: RE | Admit: 2008-04-22 | Discharge: 2008-04-22 | Payer: Self-pay | Admitting: Family Medicine

## 2008-04-22 ENCOUNTER — Telehealth: Payer: Self-pay | Admitting: Family Medicine

## 2008-04-22 DIAGNOSIS — R079 Chest pain, unspecified: Secondary | ICD-10-CM

## 2008-04-22 LAB — CONVERTED CEMR LAB: Hgb A1c MFr Bld: 10.5 %

## 2008-04-25 ENCOUNTER — Encounter: Payer: Self-pay | Admitting: Family Medicine

## 2008-05-05 ENCOUNTER — Encounter: Payer: Self-pay | Admitting: Family Medicine

## 2008-05-06 ENCOUNTER — Encounter
Admission: RE | Admit: 2008-05-06 | Discharge: 2008-07-30 | Payer: Self-pay | Admitting: Physical Medicine and Rehabilitation

## 2008-05-07 ENCOUNTER — Ambulatory Visit: Payer: Self-pay | Admitting: Physical Medicine and Rehabilitation

## 2008-05-22 ENCOUNTER — Telehealth (INDEPENDENT_AMBULATORY_CARE_PROVIDER_SITE_OTHER): Payer: Self-pay | Admitting: *Deleted

## 2008-05-22 ENCOUNTER — Ambulatory Visit: Payer: Self-pay | Admitting: Family Medicine

## 2008-05-22 LAB — CONVERTED CEMR LAB
Nitrite: NEGATIVE
Protein, U semiquant: 30

## 2008-06-04 ENCOUNTER — Ambulatory Visit: Payer: Self-pay | Admitting: Physical Medicine and Rehabilitation

## 2008-06-29 ENCOUNTER — Encounter: Payer: Self-pay | Admitting: Family Medicine

## 2008-07-02 ENCOUNTER — Ambulatory Visit: Payer: Self-pay | Admitting: Physical Medicine and Rehabilitation

## 2008-07-30 ENCOUNTER — Encounter
Admission: RE | Admit: 2008-07-30 | Discharge: 2008-10-28 | Payer: Self-pay | Admitting: Physical Medicine and Rehabilitation

## 2008-08-01 ENCOUNTER — Ambulatory Visit: Payer: Self-pay | Admitting: Physical Medicine and Rehabilitation

## 2008-08-29 ENCOUNTER — Ambulatory Visit: Payer: Self-pay | Admitting: Physical Medicine and Rehabilitation

## 2008-09-11 ENCOUNTER — Telehealth: Payer: Self-pay | Admitting: Family Medicine

## 2008-09-12 ENCOUNTER — Ambulatory Visit: Payer: Self-pay | Admitting: Family Medicine

## 2008-09-12 DIAGNOSIS — J019 Acute sinusitis, unspecified: Secondary | ICD-10-CM

## 2008-09-12 DIAGNOSIS — R300 Dysuria: Secondary | ICD-10-CM

## 2008-09-12 LAB — CONVERTED CEMR LAB
Bilirubin Urine: NEGATIVE
Glucose, Urine, Semiquant: 500
pH: 6

## 2008-09-13 ENCOUNTER — Encounter (INDEPENDENT_AMBULATORY_CARE_PROVIDER_SITE_OTHER): Payer: Self-pay | Admitting: Family Medicine

## 2008-09-18 ENCOUNTER — Telehealth: Payer: Self-pay | Admitting: Family Medicine

## 2008-09-26 ENCOUNTER — Ambulatory Visit: Payer: Self-pay | Admitting: Physical Medicine and Rehabilitation

## 2008-10-02 ENCOUNTER — Encounter: Payer: Self-pay | Admitting: Family Medicine

## 2008-10-10 ENCOUNTER — Ambulatory Visit: Payer: Self-pay | Admitting: Family Medicine

## 2008-10-20 ENCOUNTER — Encounter
Admission: RE | Admit: 2008-10-20 | Discharge: 2009-01-18 | Payer: Self-pay | Admitting: Physical Medicine and Rehabilitation

## 2008-10-24 ENCOUNTER — Ambulatory Visit: Payer: Self-pay | Admitting: Physical Medicine and Rehabilitation

## 2008-11-16 ENCOUNTER — Encounter: Payer: Self-pay | Admitting: Family Medicine

## 2008-11-21 ENCOUNTER — Ambulatory Visit: Payer: Self-pay | Admitting: Physical Medicine and Rehabilitation

## 2008-12-19 ENCOUNTER — Ambulatory Visit: Payer: Self-pay | Admitting: Physical Medicine and Rehabilitation

## 2009-01-09 ENCOUNTER — Ambulatory Visit: Payer: Self-pay | Admitting: Physical Medicine and Rehabilitation

## 2009-02-04 ENCOUNTER — Encounter
Admission: RE | Admit: 2009-02-04 | Discharge: 2009-05-05 | Payer: Self-pay | Admitting: Physical Medicine and Rehabilitation

## 2009-02-06 ENCOUNTER — Ambulatory Visit: Payer: Self-pay | Admitting: Physical Medicine and Rehabilitation

## 2009-03-02 ENCOUNTER — Telehealth: Payer: Self-pay | Admitting: Family Medicine

## 2009-03-03 ENCOUNTER — Encounter: Payer: Self-pay | Admitting: Sports Medicine

## 2009-03-03 ENCOUNTER — Ambulatory Visit: Payer: Self-pay | Admitting: Family Medicine

## 2009-03-03 DIAGNOSIS — B373 Candidiasis of vulva and vagina: Secondary | ICD-10-CM | POA: Insufficient documentation

## 2009-03-03 LAB — CONVERTED CEMR LAB
Bilirubin Urine: NEGATIVE
Glucose, Urine, Semiquant: 250
Nitrite: NEGATIVE
Protein, U semiquant: >=300
RBC / HPF: UNDETERMINED
Specific Gravity, Urine: >=1.03
Urobilinogen, UA: 1
pH: 5.5

## 2009-03-04 ENCOUNTER — Encounter: Payer: Self-pay | Admitting: *Deleted

## 2009-03-04 ENCOUNTER — Ambulatory Visit: Payer: Self-pay | Admitting: Physical Medicine and Rehabilitation

## 2009-03-05 ENCOUNTER — Ambulatory Visit (HOSPITAL_COMMUNITY): Admission: RE | Admit: 2009-03-05 | Discharge: 2009-03-05 | Payer: Self-pay | Admitting: Family Medicine

## 2009-04-03 ENCOUNTER — Ambulatory Visit: Payer: Self-pay | Admitting: Physical Medicine and Rehabilitation

## 2009-05-01 ENCOUNTER — Ambulatory Visit: Payer: Self-pay | Admitting: Physical Medicine and Rehabilitation

## 2009-05-20 ENCOUNTER — Encounter
Admission: RE | Admit: 2009-05-20 | Discharge: 2009-07-09 | Payer: Self-pay | Admitting: Physical Medicine and Rehabilitation

## 2009-05-27 ENCOUNTER — Ambulatory Visit: Payer: Self-pay | Admitting: Physical Medicine and Rehabilitation

## 2009-06-24 ENCOUNTER — Ambulatory Visit: Payer: Self-pay | Admitting: Physical Medicine and Rehabilitation

## 2009-07-24 ENCOUNTER — Encounter
Admission: RE | Admit: 2009-07-24 | Discharge: 2009-10-22 | Payer: Self-pay | Admitting: Physical Medicine and Rehabilitation

## 2009-07-27 ENCOUNTER — Ambulatory Visit: Payer: Self-pay | Admitting: Physical Medicine and Rehabilitation

## 2009-08-06 ENCOUNTER — Telehealth: Payer: Self-pay | Admitting: Family Medicine

## 2009-08-07 ENCOUNTER — Emergency Department (HOSPITAL_COMMUNITY): Admission: EM | Admit: 2009-08-07 | Discharge: 2009-08-07 | Payer: Self-pay | Admitting: Emergency Medicine

## 2009-08-20 ENCOUNTER — Ambulatory Visit: Payer: Self-pay | Admitting: Physical Medicine and Rehabilitation

## 2009-09-16 ENCOUNTER — Ambulatory Visit: Payer: Self-pay | Admitting: Physical Medicine and Rehabilitation

## 2009-10-15 ENCOUNTER — Ambulatory Visit: Payer: Self-pay | Admitting: Physical Medicine and Rehabilitation

## 2009-11-05 ENCOUNTER — Encounter
Admission: RE | Admit: 2009-11-05 | Discharge: 2010-02-03 | Payer: Self-pay | Admitting: Physical Medicine and Rehabilitation

## 2009-11-11 ENCOUNTER — Ambulatory Visit: Payer: Self-pay | Admitting: Physical Medicine and Rehabilitation

## 2009-12-02 ENCOUNTER — Encounter: Payer: Self-pay | Admitting: Family Medicine

## 2009-12-11 ENCOUNTER — Ambulatory Visit: Payer: Self-pay | Admitting: Physical Medicine and Rehabilitation

## 2009-12-30 ENCOUNTER — Ambulatory Visit: Payer: Self-pay | Admitting: Physical Medicine and Rehabilitation

## 2010-01-27 ENCOUNTER — Ambulatory Visit: Payer: Self-pay | Admitting: Physical Medicine and Rehabilitation

## 2010-02-16 ENCOUNTER — Emergency Department (HOSPITAL_COMMUNITY): Admission: EM | Admit: 2010-02-16 | Discharge: 2010-02-16 | Payer: Self-pay | Admitting: Emergency Medicine

## 2010-02-17 ENCOUNTER — Encounter
Admission: RE | Admit: 2010-02-17 | Discharge: 2010-05-17 | Payer: Self-pay | Admitting: Physical Medicine and Rehabilitation

## 2010-02-24 ENCOUNTER — Ambulatory Visit: Payer: Self-pay | Admitting: Physical Medicine and Rehabilitation

## 2010-03-18 ENCOUNTER — Encounter: Admission: RE | Admit: 2010-03-18 | Discharge: 2010-03-18 | Payer: Self-pay | Admitting: Family Medicine

## 2010-03-18 ENCOUNTER — Ambulatory Visit: Payer: Self-pay | Admitting: Family Medicine

## 2010-03-18 DIAGNOSIS — J069 Acute upper respiratory infection, unspecified: Secondary | ICD-10-CM | POA: Insufficient documentation

## 2010-03-18 DIAGNOSIS — R062 Wheezing: Secondary | ICD-10-CM

## 2010-03-18 DIAGNOSIS — R634 Abnormal weight loss: Secondary | ICD-10-CM

## 2010-03-24 ENCOUNTER — Ambulatory Visit: Payer: Self-pay | Admitting: Physical Medicine and Rehabilitation

## 2010-04-13 ENCOUNTER — Telehealth: Payer: Self-pay | Admitting: Family Medicine

## 2010-04-21 ENCOUNTER — Ambulatory Visit: Payer: Self-pay | Admitting: Physical Medicine and Rehabilitation

## 2010-05-17 ENCOUNTER — Encounter
Admission: RE | Admit: 2010-05-17 | Discharge: 2010-07-15 | Payer: Self-pay | Source: Home / Self Care | Attending: Physical Medicine and Rehabilitation | Admitting: Physical Medicine and Rehabilitation

## 2010-05-21 ENCOUNTER — Ambulatory Visit: Payer: Self-pay | Admitting: Physical Medicine and Rehabilitation

## 2010-06-09 ENCOUNTER — Encounter: Payer: Self-pay | Admitting: Family Medicine

## 2010-06-16 ENCOUNTER — Ambulatory Visit: Payer: Self-pay | Admitting: Physical Medicine and Rehabilitation

## 2010-07-15 ENCOUNTER — Ambulatory Visit: Payer: Self-pay | Admitting: Physical Medicine and Rehabilitation

## 2010-08-02 ENCOUNTER — Encounter
Admission: RE | Admit: 2010-08-02 | Discharge: 2010-08-17 | Payer: Self-pay | Source: Home / Self Care | Attending: Physical Medicine and Rehabilitation | Admitting: Physical Medicine and Rehabilitation

## 2010-08-04 ENCOUNTER — Ambulatory Visit
Admission: RE | Admit: 2010-08-04 | Discharge: 2010-08-04 | Payer: Self-pay | Source: Home / Self Care | Attending: Physical Medicine and Rehabilitation | Admitting: Physical Medicine and Rehabilitation

## 2010-08-08 ENCOUNTER — Encounter: Payer: Self-pay | Admitting: Sports Medicine

## 2010-08-17 NOTE — Progress Notes (Signed)
Summary: triage  Phone Note Call from Patient Call back at [phone]   Caller: Patient Summary of Call: Pt says she needs something for urinary tract infection.  Says she can't come in because her feet hurt too bad to walk.  Pt uses [facility] in [place]. Initial call taken by: Clydell Hakim,  August 06, 2009 2:39 PM  Follow-up for Phone Call        states she has a fever as well. states she is unable to walk so cannot come here. unable to go to UC near where she lives. told her no md will rx without a visit. she insisted I ask another md. told her I will call her back Follow-up by: Gladstone Pih,  August 06, 2009 3:01 PM  Additional Follow-up for Phone Call Additional follow up Details #1::        Gave pt dr's advise.  Pt said thank you bye and hung up. Additional Follow-up by: Gladstone Pih,  August 06, 2009 4:42 PM    Additional Follow-up for Phone Call Additional follow up Details #2::    noted.  if patient can not walk and has an UTI she should call the ambulance and be seen in the ED. Thanks for your advice to the patient.  Follow-up by: Jamie Brookes MD,  August 08, 2009 8:26 PM  For best care, patient needs to be seen and assessed prior to prescribing antibiotics. Paula Compton MD  August 06, 2009 4:30 PM

## 2010-08-17 NOTE — Assessment & Plan Note (Signed)
 Summary: uti per pt/[facility]   Vital Signs:  Patient profile:   60 year old female Height:      60.75 inches Weight:      153.7 pounds BMI:     29.39 Temp:     98.4 degrees F oral Pulse rate:   96 / minute BP sitting:   119 / 86  (right arm) Cuff size:   large  Vitals Entered By: Chrissie Lerner CMA, (March 03, 2009 8:51 AM) CC: ? uti per patient Is Patient Diabetic? No Pain Assessment Patient in pain? no        Primary Care Provider:  JEOFFREY ANGER MD  CC:  ? uti per patient.  History of Present Illness: 64F with DM2 and one wk hx of dysuria.  Painful, burning, suprapubic pain, no fevers/chills, nausea, no vomiting, no vaginal discharge.   Pt also has hx recurrent vaginal candidiasis, has been using OTC monistat to no avail.  Habits & Providers  Alcohol-Tobacco-Diet     Tobacco Status: never  Allergies: 1)  ! Codeine  Past History:  Past Medical History: Last updated: 05/25/2008 h/o gastritis takes 2 pecid a day  +RF Follow w/ Dr Everlean (rheum)  CAD w/ h/o MI in Sept 2006  with 5 stents  Dr. Cathryne Harold, MD, pain doctor perscribes Percocet 10/325 TID-QID  Past Surgical History: Last updated: 04/12/2007 BMD Scan T = -0.76 (r calcaneus) - 08/18/2000 Cardiolyte 8/01 anterior ischemia - 04/15/2005, cardiolyte mod risk study - 06/27/2005 Cath 9/06 4 vessel dz:  4 stents placed - 04/15/2005 compression fx T12 and L3, kyphoplasty x2 - 01/25/2005 coronary spasm by Cath 8/01 - 04/15/2005 holter monitor nml - 06/29/2005 hysterectomy-full - 01/25/2005 MRI:5/06 compression fx T12 and L3 - 04/15/2005 Xray of hands erosions B likley RA - 06/29/2005  Family History: Last updated: 05/25/2008 Mom: died at 64 of emphtsema Dad: died at 40 cardiomegaly and heart failure Brother: has emphysema baby Brother: diabetes Sister: diabetes, asthma Sister: diabetes baby Sister: drug addict, lives on streets  Social History: Last updated: May 25, 2008 1/2 ppd smoker; daughter  lives with her, support in [place]; no IVDU, no ETOH (beer once a year)  Social History: Smoking Status:  never  Review of Systems       See HPI  Physical Exam  General:  Well-developed,well-nourished,in no acute distress; alert,appropriate and cooperative throughout examination Head:  Normocephalic and atraumatic without obvious abnormalities.  Eyes:  No corneal or conjunctival inflammation noted. EOMI. Perrla.  Ears:  External ear exam shows no significant lesions or deformities.   Nose:  External nasal examination shows no deformity or inflammation. Lungs:  Normal respiratory effort, chest expands symmetrically. Lungs are clear to auscultation, no crackles or wheezes. Heart:  Normal rate and regular rhythm. S1 and S2 normal without gallop, murmur, click, rub or other extra sounds. Abdomen:  Bowel sounds positive,abdomen soft and TTP suprapubic, without masses, organomegaly or hernias noted.  Mild left flank tenderness with Lloyd's punch. Extremities:  No clubbing, cyanosis, edema, or deformity noted with normal full range of motion of all joints.     Impression & Recommendations:  Problem # 1:  DYSURIA (ICD-788.1) Assessment Deteriorated UA suggestive of UTI.  Previously had Citrobacter sens to Macrobid.  Will send urine for Cx and tx with macrobid for 14 days.  With her DM2 this is a complicated UTI but she is able to take by mouth, afebrile and does not appear toxic so I think she can be treated as an outpt.  Will treat with 14 day course of ABx and check a renal ultrasound to rule out abcess/pyelo.  Shot of phenergan  now and then oral zofran  for nausea. RTC 2 weeks to reassess.  Her updated medication list for this problem includes:    Macrobid 100 Mg Caps (Nitrofurantoin monohyd macro) ..... 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  Her updated medication list for this problem includes:    Fluconazole 150 Mg Tabs (Fluconazole) ..... One tab by mouth q72h x 3 doses, then qweekly x 6 months then stop  Complete Medication List: 1)  Metoprolol Tartrate 50 Mg Tabs (Metoprolol tartrate) .... 1/2 tab bid 2)  Glucotrol  10 Mg Tabs (Glipizide ) .... Take 1 tab bid 3)  Zocor  80 Mg Tabs (Simvastatin ) .SABRA.. 1 tab by mouth at bedtime 4)  Lantus  100 Unit/ml Soln (Insulin  glargine) .... 20 units daily 5)  Metformin  Hcl 500 Mg Tabs (Metformin  hcl) .... One tab bid 6)  Percocet 10-325 Mg Tabs (Oxycodone -acetaminophen ) 7)  Accupril 10 Mg Tabs (Quinapril hcl) .SABRA.. 1 by mouth daily 8)  Aspirin 81 Mg Tbec (Aspirin) .... One by mouth every day 9)  Methotrexate 2.5 Mg Tabs (Methotrexate sodium) .... 4 tabs every friday and saturday 10)  Prednisone  5 Mg Tabs (Prednisone ) 11)  Flonase 50 Mcg/act Susp (Fluticasone propionate) .... 2 sprays each nostril daily 12)  Nitroglycerin  0.4 Mg Subl (Nitroglycerin ) .SABRA.. 1 tab under tongue as needed for chest pain - if helps, call 911 13)  Proair Hfa 108 (90 Base) Mcg/act Aers (Albuterol sulfate) .SABRA.. 1-2 puffs every 4 hours as needed for shortness of breath/wheezing 14)  Zofran  Odt 4 Mg Tbdp (Ondansetron ) .... One tab by mouth q8h as needed for nausea 15)  Macrobid 100 Mg Caps (Nitrofurantoin monohyd macro) .... One tab po two times a day x 14 days 16)  Fluconazole 150 Mg Tabs (Fluconazole) .... One tab by mouth q72h x 3 doses, then qweekly x 6 months then stop     Patient Instructions: 1)  Good to meet  you today: 2)  Shot of Phenergan , 3)  Macrobid for 14 days 4)  Diflucan for 6 months 5)  Zofran  oral for nausea 6)  Kidney Ultrasound 7)  Come back in 2 weeks to see how you are doing.   8)  Call back or go to ER if you develop intractable  vomiting, high fevers, chills, SOB, CP, severe worsening abdominal pain. 9)  -Dr. ONEIDA. Prescriptions: FLUCONAZOLE 150 MG TABS (FLUCONAZOLE) One tab by mouth q72h x 3 doses, then qweekly x 6 months then stop  #27 x 0   Entered and Authorized by:   Debby Petties MD   Signed by:   Debby Petties MD on 03/03/2009   Method used:   Electronically to        [facility] (retail)       [address]       [place], [place]  [address]       Ph: [phone]       Fax: [phone]   RxID:   [id number] MACROBID 100 MG CAPS (NITROFURANTOIN MONOHYD MACRO) One tab Po two times a day x 14 days  #  28 x 0   Entered and Authorized by:   Debby Petties MD   Signed by:   Debby Petties MD on 03/03/2009   Method used:   Electronically to        [facility] (retail)       [address]       [place], [place]  [address]       Ph: [phone]       Fax: [phone]   RxID:   [id number] ZOFRAN  ODT 4 MG TBDP (ONDANSETRON ) One tab by mouth q8h as needed for nausea  #10 x 0   Entered and Authorized by:   Debby Petties MD   Signed by:   Debby Petties MD on 03/03/2009   Method used:   Electronically to        [facility] (retail)       [address]       [place], [place]  [address]       Ph: [phone]       Fax: [phone]   RxID:   [id number]   Laboratory Results   Urine Tests  Date/Time Received: March 03, 2009 8:54 AM  Date/Time Reported: March 03, 2009 9:50 AM   Routine Urinalysis   Color: yellow Appearance: Clear Glucose: 250   (Normal Range: Negative) Bilirubin: moderate-icto negative   (Normal Range: Negative) Ketone: trace (5)   (Normal Range: Negative) Spec. Gravity: >=1.030   (Normal Range: 1.003-1.035) Blood: large   (Normal Range: Negative) pH: 5.5   (Normal Range: 5.0-8.0) Protein: >=300   (Normal Range:  Negative) Urobilinogen: 1.0   (Normal Range: 0-1) Nitrite: negative   (Normal Range: Negative) Leukocyte Esterace: moderate   (Normal Range: Negative)  Urine Microscopic WBC/HPF: TNTC RBC/HPF: unable to visualize due to volume of WBC's Bacteria/HPF: 3+ Epithelial/HPF: 5-10    Comments: URINE SENT FOR CULTURE ...........test performed by...........SABRAArland Morel, CMA

## 2010-08-17 NOTE — Progress Notes (Signed)
Summary: needs meds  Phone Note Call from Patient Call back at Home Phone [phone] Call back at Work Phone [phone]   Caller: Patient Summary of Call: pt cough is getting worse and wants cough syrup called in to [facility]- [place], [place]  Initial call taken by: De Nurse,  April 13, 2010 8:54 AM  Follow-up for Phone Call        tried to get her to come see md. states it is hard for her to come in & that md told her at last visit to call & she will send cough med in. the one she has been using is not helping at all.  told her I will send a message to pcp & will call her with md response Follow-up by: Golden Circle RN,  April 13, 2010 9:06 AM  Additional Follow-up for Phone Call Additional follow up Details #1::        md called back & LM. I called her back & LM Additional Follow-up by: Golden Circle RN,  April 13, 2010 11:02 AM    Additional Follow-up for Phone Call Additional follow up Details #2::    called in tessalon pearles 100mg  ,one q12 hrs as needed cough #28 with no refills per md.  told pt that if no improvement in a week, she must be seen by the doctor. advised smoking cessation & a better diet. eats "mostly soups" she agreed with plan Follow-up by: Golden Circle RN,  April 13, 2010 11:41 AM  Thank you for the care [facility]. Jamie Brookes MD  April 14, 2010 2:46 PM

## 2010-08-17 NOTE — Assessment & Plan Note (Signed)
Summary: URI symptoms, DM2 worse, abnormal weight loss   Vital Signs:  Patient profile:   60 year old female Weight:      136.4 pounds BMI:     26.08 Temp:     98.7 degrees F oral Pulse rate:   89 / minute Pulse rhythm:   regular BP sitting:   114 / 83  (left arm) Cuff size:   regular  Vitals Entered By: Loralee Pacas CMA (March 18, 2010 11:06 AM)  Primary Care Delwyn Scoggin:  Jamie Brookes MD  CC:  URI, DM2, and weight loss.  History of Present Illness: URI: Pt has been having a "raw" throat and is taking Nyquil. She says occasionally she coughs so hard at night that she is coughing up blood. She is having some fever and chills. She does continue to smoke (60 pack year history) and does not want to know if she has lung ca. She is currently on imunosuppresent therapy for her RA and gets infections more easily.   DM2: Pt has not bee checking her CBgt's in the last 4 months. Today in the office her CBGT is 393. She says that she has lost 50 lbs in the last 6 months. She has no appetite, gets nauseated and has some diarrhea. When asked about why she has been deglecting herself she admits to being depressed but is emphatic that she does not want anything to treat it.   Weight loss: Pt has lost 50 lbs in the last 6 months. She says that nothing tastes good, she has no appetite, and she is nauseated. She occasionally has diarrhea. She does not want to know if she has cancer.   Allergies: 1)  ! Codeine   Impression & Recommendations:  Problem # 1:  UPPER RESPIRATORY INFECTION (ICD-465.9) Assessment New Pt is having some URI symptoms. She is taking some OTC meds for coughing. Suspect viral uri or bronchitis forming. Will get CXR. If no changes on CRX will do supportive care. If not better in 5 days will consider Abx for chronic bronchitis.   Her updated medication list for this problem includes:    Aspirin 81 Mg Tbec (Aspirin) ..... One by mouth every day    Promethazine Hcl 25 Mg  Tabs (Promethazine hcl) .Marland Kitchen... Take 0.5-1 tablet as needed for nausea    Robitussin Dm 100-10 Mg/72ml Syrp (Dextromethorphan-guaifenesin) .Marland Kitchen... Follow the instructions on the bottle for dosing.  1 large bottle  Orders: [facility]- Est  Level 4 (99214)  Problem # 2:  DM, UNCOMPLICATED, TYPE II, UNCONTROLLED (ICD-250.02) Assessment: Deteriorated Pt has not been taking care of herself for the last 4 months. She has not been checking her CBg's nor taking her meds. Plan to get her back on her meds, refilled all meds, stopped Lantus (she isn't taking it anyway) and have her come back to get fasting labs. Pt does not have a working meter. Sent in Rx for Prodigy meter.   The following medications were removed from the medication list:    Lantus 100 Unit/ml Soln (Insulin glargine) .Marland Kitchen... 20 units daily Her updated medication list for this problem includes:    Glucotrol 10 Mg Tabs (Glipizide) .Marland Kitchen... Take 1 tab bid    Metformin Hcl 500 Mg Tabs (Metformin hcl) .Marland Kitchen... 1 tab bid    Accupril 10 Mg Tabs (Quinapril hcl) .Marland Kitchen... 1 by mouth daily    Aspirin 81 Mg Tbec (Aspirin) ..... One by mouth every day  Orders: [facility]- Est  Level 4 (99214)Future Orders:  Comp Met-[facility] [id number]) ... 03/31/2011 A1C-[facility] (09811) ... 03/23/2011  Problem # 3:  WEIGHT LOSS, ABNORMAL (ICD-783.21) Assessment: New Concern for lung ca with her 60 pack year history. Pt is very emphatic that she does not want to know if she has lung ca. Plan to get CXR. Encourage proper eating habits.   Orders: CXR- 2view (CXR) [facility]- Est  Level 4 (99214)Future Orders: CBC-[facility] ([id number]) ... 03/31/2011  Complete Medication List: 1)  Glucotrol 10 Mg Tabs (Glipizide) .... Take 1 tab bid 2)  Simvastatin 40 Mg Tabs (Simvastatin) .... Take 1 pill daily 3)  Metformin Hcl 500 Mg Tabs (Metformin hcl) .Marland Kitchen.. 1 tab bid 4)  Percocet 10-325 Mg Tabs (Oxycodone-acetaminophen) 5)  Accupril 10 Mg Tabs (Quinapril hcl) .Marland Kitchen.. 1 by mouth daily 6)  Aspirin 81 Mg Tbec (Aspirin) ....  One by mouth every day 7)  Methotrexate 2.5 Mg Tabs (Methotrexate sodium) .... 4 tabs every friday and saturday 8)  Nitroglycerin 0.4 Mg Subl (Nitroglycerin) .Marland Kitchen.. 1 tab under tongue as needed for chest pain - if helps, call 911 9)  Promethazine Hcl 25 Mg Tabs (Promethazine hcl) .... Take 0.5-1 tablet as needed for nausea 10)  Plavix 75 Mg Tabs (Clopidogrel bisulfate) .Marland Kitchen.. 1 tab by mouth qday 11)  Folic Acid 1 Mg Tabs (Folic acid) .... Take 1 pill daily 12)  Robitussin Dm 100-10 Mg/3ml Syrp (Dextromethorphan-guaifenesin) .... Follow the instructions on the bottle for dosing.  1 large bottle 13)  Prodigy Pocket Blood Glucose W/device Kit (Blood glucose monitoring suppl) .... Use as directed 14)  Prodigy No Coding Blood Gluc Strp (Glucose blood) .... Test glucose every morning before you eat or drink. 15)  Prodigy Twist Top Lancets 28g Misc (Lancets) .... Test daily if possible  Other Orders: Glucose Cap-[facility] (29562) Future Orders: Lipid-[facility] ([id number]) ... 04/07/2011  Patient Instructions: 1)  You will have labs to be done any morning you want to come in fasting.  2)  You have a CRX to be done today.  3)  I will call you with results.  Prescriptions: PRODIGY POCKET BLOOD GLUCOSE W/DEVICE KIT (BLOOD GLUCOSE MONITORING SUPPL) use as directed  #1 x 0   Entered and Authorized by:   Jamie Brookes MD   Signed by:   Jamie Brookes MD on 03/18/2010   Method used:   Electronically to        [facility].* (retail)       [address]       [place], [place]  [address]       Ph: [phone]       Fax: [phone]   RxID:   [id number] ROBITUSSIN DM 100-10 MG/5ML SYRP (DEXTROMETHORPHAN-GUAIFENESIN) follow the instructions on the bottle for dosing.  1 large bottle  #1 x 1   Entered and Authorized by:   Jamie Brookes MD   Signed by:   Jamie Brookes MD on 03/18/2010   Method used:   Electronically to        [facility].* (retail)       [address]       [place], [place]  [address]       Ph: [phone]       Fax: [phone]   RxID:   [id number] PLAVIX 75 MG TABS (CLOPIDOGREL BISULFATE) 1 tab by mouth qDay  #90 x 3   Entered and Authorized by:  Jamie Brookes MD   Signed by:   Jamie Brookes MD on 03/18/2010   Method used:   Electronically to        [facility].* (retail)       [address]       [place], [place]  [address]       Ph: [phone]       Fax: [phone]   RxID:   [id number] METFORMIN HCL 500 MG  TABS (METFORMIN HCL) 1 tab bid  #180 x 3   Entered and Authorized by:   Jamie Brookes MD   Signed by:   Jamie Brookes MD on 03/18/2010   Method used:   Electronically to        [facility].* (retail)       [address]       [place], [place]  [address]       Ph: [phone]       Fax: [phone]   RxID:   [id number] GLUCOTROL 10 MG  TABS (GLIPIZIDE) take 1 tab BID  #180 x 3   Entered and Authorized by:   Jamie Brookes MD   Signed by:   Jamie Brookes MD on 03/18/2010   Method used:   Electronically to        [facility].* (retail)       [address]       [place], [place]  [address]       Ph: [phone]       Fax: [phone]   RxID:   [id number] NITROGLYCERIN 0.4 MG SUBL (NITROGLYCERIN) 1 tab under tongue as needed for chest pain - if helps, call 911  #30 x 5   Entered and Authorized by:   Jamie Brookes MD   Signed by:   Jamie Brookes MD on 03/18/2010   Method used:   Electronically to        [facility].* (retail)       [address]       [place], [place]  [address]       Ph: [phone]       Fax: [phone]   RxID:   [id number] ACCUPRIL 10 MG TABS (QUINAPRIL HCL) 1 by mouth daily  #90 x 3   Entered and Authorized by:   Jamie Brookes MD   Signed by:   Jamie Brookes MD on  03/18/2010   Method used:   Electronically to        [facility] (retail)       [address]       [place], [place]  [address]       Ph: [phone]       Fax: [phone]   RxID:   [id number] SIMVASTATIN 40 MG TABS (SIMVASTATIN) take 1 pill daily  #90 x 3   Entered and Authorized by:   Jamie Brookes MD   Signed by:   Jamie Brookes MD on 03/18/2010   Method used:   Electronically to        [facility] (retail)       [address]       [place], [place]  [address]       Ph: [phone]       Fax: [phone]   RxID:   [id number]

## 2010-08-17 NOTE — Miscellaneous (Signed)
Summary: Refill Accuprl  Clinical Lists Changes  Medications: Rx of ACCUPRIL 10 MG TABS (QUINAPRIL HCL) 1 by mouth daily;  #30 x 11;  Signed;  Entered by: Jamie Brookes MD;  Authorized by: Jamie Brookes MD;  Method used: Electronically to [facility] [address], [place], [place], [place]  [address], Ph: [phone], Fax: [phone]    Prescriptions: ACCUPRIL 10 MG TABS (QUINAPRIL HCL) 1 by mouth daily  #30 x 11   Entered and Authorized by:   Jamie Brookes MD   Signed by:   Jamie Brookes MD on 12/02/2009   Method used:   Electronically to        [facility] (retail)       [address]       [place], [place]  [address]       Ph: [phone]       Fax: [phone]   RxID:   [id number]

## 2010-08-22 ENCOUNTER — Telehealth: Payer: Self-pay | Admitting: Family Medicine

## 2010-08-23 ENCOUNTER — Emergency Department (HOSPITAL_COMMUNITY)
Admission: EM | Admit: 2010-08-23 | Discharge: 2010-08-23 | Disposition: A | Discharge status: Home / Self Care | Payer: Medicaid Other | Attending: Emergency Medicine | Admitting: Emergency Medicine

## 2010-08-23 DIAGNOSIS — N390 Urinary tract infection, site not specified: Secondary | ICD-10-CM | POA: Insufficient documentation

## 2010-08-23 DIAGNOSIS — I2510 Atherosclerotic heart disease of native coronary artery without angina pectoris: Secondary | ICD-10-CM | POA: Insufficient documentation

## 2010-08-23 DIAGNOSIS — M1990 Unspecified osteoarthritis, unspecified site: Secondary | ICD-10-CM | POA: Insufficient documentation

## 2010-08-23 DIAGNOSIS — R350 Frequency of micturition: Secondary | ICD-10-CM | POA: Insufficient documentation

## 2010-08-23 DIAGNOSIS — I10 Essential (primary) hypertension: Secondary | ICD-10-CM | POA: Insufficient documentation

## 2010-08-23 DIAGNOSIS — R300 Dysuria: Secondary | ICD-10-CM | POA: Insufficient documentation

## 2010-08-23 DIAGNOSIS — M069 Rheumatoid arthritis, unspecified: Secondary | ICD-10-CM | POA: Insufficient documentation

## 2010-08-23 DIAGNOSIS — E119 Type 2 diabetes mellitus without complications: Secondary | ICD-10-CM | POA: Insufficient documentation

## 2010-08-23 DIAGNOSIS — M810 Age-related osteoporosis without current pathological fracture: Secondary | ICD-10-CM | POA: Insufficient documentation

## 2010-08-23 DIAGNOSIS — E7800 Pure hypercholesterolemia, unspecified: Secondary | ICD-10-CM | POA: Insufficient documentation

## 2010-08-23 DIAGNOSIS — R10819 Abdominal tenderness, unspecified site: Secondary | ICD-10-CM | POA: Insufficient documentation

## 2010-08-23 DIAGNOSIS — Z79899 Other long term (current) drug therapy: Secondary | ICD-10-CM | POA: Insufficient documentation

## 2010-08-23 LAB — URINALYSIS, ROUTINE W REFLEX MICROSCOPIC
Ketones, ur: NEGATIVE mg/dL
Protein, ur: NEGATIVE mg/dL
Urobilinogen, UA: 0.2 mg/dL (ref 0.0–1.0)

## 2010-08-23 LAB — URINE MICROSCOPIC-ADD ON

## 2010-08-23 LAB — GLUCOSE, CAPILLARY: Glucose-Capillary: 338 mg/dL — ABNORMAL HIGH (ref 70–99)

## 2010-08-24 LAB — URINE CULTURE: Culture  Setup Time: 201202061002

## 2010-08-25 NOTE — Consult Note (Signed)
Summary: [facility] - Rheumatology   RA  [facility] - Rheumatology   Imported By: De Nurse 08/13/2010 15:30:39  _____________________________________________________________________  External Attachment:    Type:   Image     Comment:   External Document

## 2010-08-26 ENCOUNTER — Other Ambulatory Visit: Payer: Self-pay | Admitting: Family Medicine

## 2010-08-26 ENCOUNTER — Telehealth: Payer: Self-pay | Admitting: Family Medicine

## 2010-08-26 DIAGNOSIS — N390 Urinary tract infection, site not specified: Secondary | ICD-10-CM

## 2010-08-26 MED ORDER — CEPHALEXIN 500 MG PO CAPS: 500.0000 mg | ORAL_CAPSULE | Freq: Three times a day (TID) | ORAL | Status: AC

## 2010-08-26 NOTE — Telephone Encounter (Signed)
Patient notified that new Rx has been sent in .  If she continues to have problem taking medication she will need to see MD.

## 2010-08-26 NOTE — Telephone Encounter (Signed)
Patient states she went to ER Monday night and was given Cipro for UTI. She has tried to take for 2 days  And has vomited each time she has taken .  Will send message to MD. Paged Dr.  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 Will ask Dr. Leveda Anna if he will address.

## 2010-08-31 ENCOUNTER — Ambulatory Visit (HOSPITAL_BASED_OUTPATIENT_CLINIC_OR_DEPARTMENT_OTHER): Payer: Medicaid Other

## 2010-08-31 ENCOUNTER — Encounter: Payer: Medicaid Other | Attending: Physical Medicine and Rehabilitation

## 2010-08-31 DIAGNOSIS — J449 Chronic obstructive pulmonary disease, unspecified: Secondary | ICD-10-CM | POA: Insufficient documentation

## 2010-08-31 DIAGNOSIS — J4489 Other specified chronic obstructive pulmonary disease: Secondary | ICD-10-CM | POA: Insufficient documentation

## 2010-08-31 DIAGNOSIS — I2510 Atherosclerotic heart disease of native coronary artery without angina pectoris: Secondary | ICD-10-CM | POA: Insufficient documentation

## 2010-08-31 DIAGNOSIS — M545 Low back pain, unspecified: Secondary | ICD-10-CM | POA: Insufficient documentation

## 2010-08-31 DIAGNOSIS — M069 Rheumatoid arthritis, unspecified: Secondary | ICD-10-CM

## 2010-08-31 DIAGNOSIS — I252 Old myocardial infarction: Secondary | ICD-10-CM | POA: Insufficient documentation

## 2010-08-31 DIAGNOSIS — M1710 Unilateral primary osteoarthritis, unspecified knee: Secondary | ICD-10-CM

## 2010-08-31 DIAGNOSIS — E119 Type 2 diabetes mellitus without complications: Secondary | ICD-10-CM | POA: Insufficient documentation

## 2010-08-31 DIAGNOSIS — M47817 Spondylosis without myelopathy or radiculopathy, lumbosacral region: Secondary | ICD-10-CM | POA: Insufficient documentation

## 2010-08-31 DIAGNOSIS — M810 Age-related osteoporosis without current pathological fracture: Secondary | ICD-10-CM | POA: Insufficient documentation

## 2010-08-31 DIAGNOSIS — G894 Chronic pain syndrome: Secondary | ICD-10-CM

## 2010-08-31 DIAGNOSIS — F17200 Nicotine dependence, unspecified, uncomplicated: Secondary | ICD-10-CM | POA: Insufficient documentation

## 2010-08-31 DIAGNOSIS — E7800 Pure hypercholesterolemia, unspecified: Secondary | ICD-10-CM | POA: Insufficient documentation

## 2010-08-31 DIAGNOSIS — M25579 Pain in unspecified ankle and joints of unspecified foot: Secondary | ICD-10-CM | POA: Insufficient documentation

## 2010-08-31 DIAGNOSIS — I4891 Unspecified atrial fibrillation: Secondary | ICD-10-CM | POA: Insufficient documentation

## 2010-08-31 DIAGNOSIS — M25539 Pain in unspecified wrist: Secondary | ICD-10-CM

## 2010-08-31 DIAGNOSIS — Z79899 Other long term (current) drug therapy: Secondary | ICD-10-CM | POA: Insufficient documentation

## 2010-09-02 NOTE — Progress Notes (Signed)
Summary: ER Line call  Phone Note Call from Patient Call back at Home Phone [phone]   Caller: Patient Summary of Call: Patient calls in stating "I have a urinary tract infection."  Has noticed frank blood in her urine starting Friday evening.  Has had dysuria and abdominal pain.  Starting yesterday, she began complaining of fevers, chills, back pain, and increasing blood.  Recommended she go straight to Urgent Care or ED.  Patient asking for [facility] clinic appt.  Again recommended going to be evaluated.  Patient agreed with plan.   Initial call taken by: Renold Don MD,  August 22, 2010 4:20 PM

## 2010-09-28 ENCOUNTER — Encounter: Payer: Medicaid Other | Attending: Physical Medicine and Rehabilitation

## 2010-09-28 ENCOUNTER — Ambulatory Visit (HOSPITAL_BASED_OUTPATIENT_CLINIC_OR_DEPARTMENT_OTHER): Payer: Medicaid Other

## 2010-09-28 DIAGNOSIS — M1710 Unilateral primary osteoarthritis, unspecified knee: Secondary | ICD-10-CM

## 2010-09-28 DIAGNOSIS — M069 Rheumatoid arthritis, unspecified: Secondary | ICD-10-CM | POA: Insufficient documentation

## 2010-09-28 DIAGNOSIS — M545 Low back pain, unspecified: Secondary | ICD-10-CM | POA: Insufficient documentation

## 2010-09-28 DIAGNOSIS — M25539 Pain in unspecified wrist: Secondary | ICD-10-CM

## 2010-09-28 DIAGNOSIS — Z79899 Other long term (current) drug therapy: Secondary | ICD-10-CM | POA: Insufficient documentation

## 2010-09-28 DIAGNOSIS — M4804 Spinal stenosis, thoracic region: Secondary | ICD-10-CM | POA: Insufficient documentation

## 2010-09-28 DIAGNOSIS — M47817 Spondylosis without myelopathy or radiculopathy, lumbosacral region: Secondary | ICD-10-CM | POA: Insufficient documentation

## 2010-09-28 DIAGNOSIS — G894 Chronic pain syndrome: Secondary | ICD-10-CM | POA: Insufficient documentation

## 2010-09-28 DIAGNOSIS — M25579 Pain in unspecified ankle and joints of unspecified foot: Secondary | ICD-10-CM | POA: Insufficient documentation

## 2010-09-30 LAB — GLUCOSE, CAPILLARY: Glucose-Capillary: 393 mg/dL — ABNORMAL HIGH (ref 70–99)

## 2010-10-04 LAB — URINALYSIS, ROUTINE W REFLEX MICROSCOPIC
Nitrite: NEGATIVE
Protein, ur: NEGATIVE mg/dL
Specific Gravity, Urine: 1.011 (ref 1.005–1.030)
Urobilinogen, UA: 0.2 mg/dL (ref 0.0–1.0)

## 2010-10-04 LAB — URINE CULTURE

## 2010-10-04 LAB — GLUCOSE, CAPILLARY: Glucose-Capillary: 286 mg/dL — ABNORMAL HIGH (ref 70–99)

## 2010-10-04 LAB — URINE MICROSCOPIC-ADD ON

## 2010-11-01 ENCOUNTER — Encounter
Payer: Medicaid Other | Attending: Physical Medicine and Rehabilitation | Admitting: Physical Medicine and Rehabilitation

## 2010-11-01 DIAGNOSIS — M79609 Pain in unspecified limb: Secondary | ICD-10-CM | POA: Insufficient documentation

## 2010-11-01 DIAGNOSIS — I2510 Atherosclerotic heart disease of native coronary artery without angina pectoris: Secondary | ICD-10-CM | POA: Insufficient documentation

## 2010-11-01 DIAGNOSIS — R110 Nausea: Secondary | ICD-10-CM | POA: Insufficient documentation

## 2010-11-01 DIAGNOSIS — M810 Age-related osteoporosis without current pathological fracture: Secondary | ICD-10-CM | POA: Insufficient documentation

## 2010-11-01 DIAGNOSIS — M545 Low back pain, unspecified: Secondary | ICD-10-CM | POA: Insufficient documentation

## 2010-11-01 DIAGNOSIS — Z79899 Other long term (current) drug therapy: Secondary | ICD-10-CM | POA: Insufficient documentation

## 2010-11-01 DIAGNOSIS — M4804 Spinal stenosis, thoracic region: Secondary | ICD-10-CM | POA: Insufficient documentation

## 2010-11-01 DIAGNOSIS — M069 Rheumatoid arthritis, unspecified: Secondary | ICD-10-CM

## 2010-11-01 DIAGNOSIS — G894 Chronic pain syndrome: Secondary | ICD-10-CM | POA: Insufficient documentation

## 2010-11-01 DIAGNOSIS — M47817 Spondylosis without myelopathy or radiculopathy, lumbosacral region: Secondary | ICD-10-CM | POA: Insufficient documentation

## 2010-11-01 DIAGNOSIS — I252 Old myocardial infarction: Secondary | ICD-10-CM | POA: Insufficient documentation

## 2010-11-01 DIAGNOSIS — E119 Type 2 diabetes mellitus without complications: Secondary | ICD-10-CM | POA: Insufficient documentation

## 2010-11-01 DIAGNOSIS — I4891 Unspecified atrial fibrillation: Secondary | ICD-10-CM | POA: Insufficient documentation

## 2010-11-01 DIAGNOSIS — E7800 Pure hypercholesterolemia, unspecified: Secondary | ICD-10-CM | POA: Insufficient documentation

## 2010-11-02 NOTE — Assessment & Plan Note (Signed)
[name] is a pleasant 60 year old woman who has a history of rheumatoid arthritis as well as lumbar spondylosis.  She has a history of multiple vertebroplasty in the past.  She has returned to [facility] for refill of her pain medications.  She follows up with Rheumatology, Dr. Lanell Matar over at [facility].  She is currently on Enbrel as well as methotrexate to manage her rheumatoid arthritis.  She also maintains a contact with primary care physician, Dr. Jerrell Mylar at [facility].  Beth [name] is back in today for refill of her pain medications.  She has multiple pain complaints including various joint pain, elbows, and wrists as well as ankles which have bothered her.  She has intermittent low back pain.  Over the last month or so, she has had increasing left lower extremity pain, it has been bothering her throughout the day.  She denies any kind of trauma or event that brought it on.  It seems as though over the last few weeks it has gradually gotten worse for her. She finds the gabapentin somewhat helpful.  She has been using it at night to decrease her to help with the leg pain.  She tells me it helped somewhat.  Her average pain is between 7 and 8 on a scale of 10.  Sleep is fair. She gets fair relief with current meds.  Pain is described as aching.  FUNCTIONAL STATUS:  She can walk a few minutes at a time.  She does use a cane.  She has difficulty with stairs.  She is able to drive.  She is independent with self-care.  Denies depression, anxiety, or suicidal ideation.  Denies problems controlling bowel or bladder at this time.  She does report occasional nausea.  Past medical, social, and family history otherwise unchanged from previous visit.  MEDICATIONS:  [facility] through [facility] include at this time: 1. Percocet 10/325 up to four times a day. 2. Phenergan 25 mg half tablet daily p.r.n. nausea. 3. Gabapentin 300 mg  t.i.d.  PHYSICAL EXAMINATION:  VITAL SIGNS:  Today, blood pressure is 142/68, pulse 83, respiration 18, and 98% saturation on room air. GENERAL:  She is well-developed, well-nourished woman, who does not appear in any distress.  She is oriented x3.  Speech is clear.  Affect is bright.  She is alert, cooperative, and pleasant.  Follows commands without difficulty.  Answers my questions appropriately. NEUROLOGIC:  Cranial nerves, coordination are intact.  Reflexes are diminished at the patellar and Achilles tendons.  No abnormal tone, clonus, or tremors are noted.  Her motor strength is overall good in both lower extremities without obvious focal deficit.  She does have a sensory deficit in the left lower extremity over the dorsum of the foot and somewhat medial as well.  She is able to transition from sitting to standing.  Her gait is stable without an assistive device at this time. She has some difficulty with tandem gait, but Romberg test is performed adequately.  No spinal tenderness is appreciated with palpation.  IMPRESSION: 1. Rheumatoid arthritis with multiple joints involved, currently     followed by Dr. Lanell Matar over at [facility], who is treating her     currently with Enbrel as well as methotrexate. 2. History of multiple vertebroplasty and chronic low back pain. 3. Mild spinal stenosis, retropulsion of T12 and canals, which is an     old problem. 4. Chronic pain syndrome. 5. Limited ambulation capacity secondary  to above.  MEDICAL PROBLEMS:  Include, history of coronary artery disease and myocardial infarction in 2006.  She is status post 5 stents. Hypercholesterolemia, rheumatoid right, tobacco abuse, COPD, chronic bronchitis, diabetes mellitus, atrial fibrillation, osteoporosis, and history of upper respiratory infections.  PLAN:  I have encouraged her to maintain contact with her rheumatologist as well as her primary care physician.  We will set her up to obtain TENS  unit per physical therapist.  I have encouraged her to walk daily. We will increase her gabapentin from 300 t.i.d. to 300 mg one at 6 a.m., 12 p.m., 5:00 p.m., and 3:00 a.m.; two at 10 p.m.  We will increase her Percocet from four times a day to five times a day.  She has 83.5 pills today.  I will see her back in 2 weeks to monitor her pain and her pill counts.  I have answered all her questions.  She is comfortable with our management plan.  Beth [name] has complicated pain at this time.  She has rheumatoid arthritis and may have some sciatic type symptoms on top of this with known spinal stenosis and previous vertebroplasty.  We will continue to follow her.     Brantley Stage, M.D. Electronically Signed    DMK/MedQ D:  11/01/2010 10:16:21  T:  11/02/2010 00:23:43  Job #:  [id number]

## 2010-11-09 ENCOUNTER — Ambulatory Visit (INDEPENDENT_AMBULATORY_CARE_PROVIDER_SITE_OTHER): Payer: Medicaid Other | Admitting: Family Medicine

## 2010-11-09 ENCOUNTER — Encounter: Payer: Self-pay | Admitting: Family Medicine

## 2010-11-09 VITALS — BP 121/82 | HR 81 | Temp 98.5°F | Wt 146.4 lb

## 2010-11-09 DIAGNOSIS — J40 Bronchitis, not specified as acute or chronic: Secondary | ICD-10-CM

## 2010-11-09 MED ORDER — GUAIFENESIN ER 600 MG PO TB12: 600.0000 mg | ORAL_TABLET | Freq: Two times a day (BID) | ORAL | Status: DC

## 2010-11-09 MED ORDER — AZITHROMYCIN 250 MG PO TABS: ORAL_TABLET | ORAL | Status: DC

## 2010-11-09 NOTE — Progress Notes (Signed)
  Subjective:    Patient ID: Beth [name], female    DOB: October 13, 1950, 60 y.o.   MRN: [id number]  URI  This is a new problem. The current episode started in the past 7 days. The problem has been unchanged. There has been no fever. Associated symptoms include congestion and coughing. Pertinent negatives include no abdominal pain, chest pain, diarrhea, dysuria, headaches, nausea, neck pain, rash, sinus pain, sneezing, sore throat, swollen glands, vomiting or wheezing. Associated symptoms comments: Hoarseness . She has tried antihistamine (lozenges) for the symptoms. The treatment provided mild relief.      Review of Systems  HENT: Positive for congestion. Negative for sore throat, sneezing and neck pain.   Respiratory: Positive for cough. Negative for wheezing.   Cardiovascular: Negative for chest pain.  Gastrointestinal: Negative for nausea, vomiting, abdominal pain and diarrhea.  Genitourinary: Negative for dysuria.  Skin: Negative for rash.  Neurological: Negative for headaches.       Objective:   Physical Exam  Constitutional: She appears well-nourished. No distress.       Hoarse voice  HENT:  Nose: Nose normal.  Mouth/Throat: Oropharynx is clear and moist. No oropharyngeal exudate.  Eyes: Conjunctivae are normal. Pupils are equal, round, and reactive to light. Right eye exhibits no discharge. Left eye exhibits no discharge.  Neck: Normal range of motion. Neck supple. No thyromegaly present.  Cardiovascular: Normal rate and regular rhythm.   Pulmonary/Chest: Effort normal and breath sounds normal. No respiratory distress. She has no wheezes. She has no rales.       + rhonchi   Abdominal: Soft.  Musculoskeletal: She exhibits no edema.  Lymphadenopathy:    She has no cervical adenopathy.  Skin: No rash noted.          Assessment & Plan:

## 2010-11-09 NOTE — Assessment & Plan Note (Signed)
Likely bronchitis.  Has only had it for a couple of days.  Advised her to try supportive measures for the next 5 days including increasing fluid intake and Mucinex.  I also provided her with a Rx of Azithromycin if not better in 5 days.

## 2010-11-09 NOTE — Patient Instructions (Signed)
Bronchitis Bronchitis is the body's way of reacting to injury and/or infection (inflammation) of the bronchi. Bronchi are the air tubes that extend from the windpipe into the lungs. If the inflammation becomes severe, it may cause shortness of breath.  CAUSES Inflammation may be caused by:  A virus.   Germs (bacteria).   Dust.   Allergens.   Pollutants and many other irritants.  The cells lining the bronchial tree are covered with tiny hairs (cilia). These constantly beat upward, away from the lungs, toward the mouth. This keeps the lungs free of pollutants. When these cells become too irritated and are unable to do their job, mucus begins to develop. This causes the characteristic cough of bronchitis. The cough clears the lungs when the cilia are unable to do their job. Without either of these protective mechanisms, the mucus would settle in the lungs. Then you would develop pneumonia. Smoking is a common cause of bronchitis and can contribute to pneumonia. Stopping this habit is the single most important thing you can do to help yourself. TREATMENT  Your caregiver may prescribe an antibiotic if the cough is caused by bacteria. Also, medicines that open up your airways make it easier to breathe. Your caregiver may also recommend or prescribe an expectorant. It will loosen the mucus to be coughed up. Only take over-the-counter or prescription medicines for pain, discomfort, or fever as directed by your caregiver.   Removing whatever causes the problem (smoking, for example) is critical to preventing the problem from getting worse.   Cough suppressants may be prescribed for relief of cough symptoms.   Inhaled medicines may be prescribed to help with symptoms now and to help prevent problems from returning.   For those with recurrent (chronic) bronchitis, there may be a need for steroid medicines.  SEEK IMMEDIATE MEDICAL CARE IF:  During treatment, you develop more pus-like mucus  (purulent sputum).   You or your child has an oral temperature above 101, not controlled by medicine.   You become progressively more ill.   You have increased difficulty breathing, wheezing, or shortness of breath.  It is necessary to seek immediate medical care if you are elderly or sick from any other disease. MAKE SURE YOU:  Understand these instructions.   Will watch your condition.   Will get help right away if you are not doing well or get worse.  Document Released: 07/04/2005 Document Re-Released: 09/28/2009 [facility] Patient Information 2011 [place], [place].

## 2010-11-17 ENCOUNTER — Encounter: Payer: Medicaid Other | Attending: Neurosurgery | Admitting: Neurosurgery

## 2010-11-17 ENCOUNTER — Ambulatory Visit: Payer: Medicaid Other | Admitting: Physical Medicine and Rehabilitation

## 2010-11-17 DIAGNOSIS — M479 Spondylosis, unspecified: Secondary | ICD-10-CM | POA: Insufficient documentation

## 2010-11-17 DIAGNOSIS — M47817 Spondylosis without myelopathy or radiculopathy, lumbosacral region: Secondary | ICD-10-CM

## 2010-11-17 DIAGNOSIS — M19079 Primary osteoarthritis, unspecified ankle and foot: Secondary | ICD-10-CM

## 2010-11-17 DIAGNOSIS — M069 Rheumatoid arthritis, unspecified: Secondary | ICD-10-CM | POA: Insufficient documentation

## 2010-11-17 DIAGNOSIS — G894 Chronic pain syndrome: Secondary | ICD-10-CM

## 2010-11-18 NOTE — Assessment & Plan Note (Signed)
This patient has been followed by Dr. Pamelia Hoit for sometime for rheumatoid arthritis as well as lumbar spondylosis.  She is here today for her refills.  Pill counts were correct.  No signs of aberrant behavior.  She is following up with Dr. Clotilde Dieter at [facility]  She states that her ankles and feet are really bad today.  She does have limited range of motion.  Here she is walking with a single-point cane, otherwise she tells me nothing has really change.  With her pain levels today, she rates about a 8 or 9.  Activities about an 8.  Pain is worse in the morning and night with stiffness.  Pain improves somewhat with medication she reports.  She does use a cane.  She can drive.  She cannot climb steps.  She is unemployed.  Review of systems shows some difficulty with vomiting and sleep apnea from time to time, otherwise unremarkable.  PAST MEDICAL HISTORY:  Unchanged.  SOCIAL HISTORY:  She is divorced.  FAMILY HISTORY:  Has a history of heart disease, lung disease, diabetes, and hypertension.  PHYSICAL EXAMINATION:  Today again she is walking with a single-point cane with slow gait that is altered by her rheumatoid arthritis.  Her ankle shows diminished range of motion and reflexes in lower extremities.  Her strength is probably 3 to 4 out of 5 when tested due to pain.  She is oriented x3.  Her affect is somewhat depressed, but still alert.  Constitutionally, she is within normal limits.  Her blood pressure 146/70, pulse 90, respirations 20, O2 sats 94% on room air.  ASSESSMENT:  Rheumatoid arthritis with pain, spinal stenosis with spondylosis.  PLAN:  We went ahead and refilled her Percocet 10/325.  Dr. Pamelia Hoit up it to 5 times a day last time so I gave her 150 with no refill.  Will follow up in the clinic in 1 month.  She has an appointment with Dr. Pamelia Hoit she states already.     Beth [name] L. Blima Dessert    RLW/MedQ D:  11/17/2010 13:49:19  T:  11/18/2010  32:35:57  Job #:  [id number]

## 2010-11-30 NOTE — Assessment & Plan Note (Signed)
Beth [name] is a 60 year old divorced female, who is being seen in our  [facility] for back pain and multiple joint pain.  She has a  history of rheumatoid arthritis and is back into our Pain and  Rehabilitation Clinic for refill of her medications.   She is also receiving treatment from Dr. Ermalene Searing and Dr. Allyson Sabal.   She states her average pain is about a 6 on a scale of 10.  She is  getting fair relief with the current meds that she is on.   Last month, she had some increased right ankle synovitis, which  interfered with her mobility fairly significantly.  She states she has  been using a crutch, which has aggravated her right axilla.   She is able to walk about five minutes at a time.  She is independent  with her self-care.  Her pain is described as fairly constant, worse  with activity, the right elbow is currently involved as well as the  right ankle.  Her low back still bothers her as well.   She denies problems controlling bowel or bladder, admits to some  depression, denies suicidal ideation.   Reports no changes in her past medical, social, or family history since  last visit.  She continues to live with her daughter and grandchildren,  smoking half a pack of cigarettes a day.   Medications provided by this clinic include:  1. Lidoderm 5%, 12 hours on and 12 hours off.  2. Promethazine half a tablet per day.  3. Lyrica 75 mg twice a day.  4. Percocet 10/325 up to four times a day.   PHYSICAL EXAMINATION:  VITAL SIGNS:  Her blood pressure is 132/68, pulse  60, respirations 17, 96% saturated on room air.  GENERAL:  She is a well-developed, well-nourished female, who appears  her stated age and does not appear in any distress.   She is oriented x3.  Her speech is clear.  Her affect is bright, alert,  cooperative, and pleasant, although she did get a little tearful when  discussing her function at one point.  She follows commands without  difficulty.  She  transitions from sitting to standing easily; however,  her gait is antalgic with decreased weightbearing to the right lower  extremity.   She has significant synovitis in both right and left ankles.  Her right  ankle is still somewhat tender.  She states that it is markedly improved  since the last month.   She lacks full extension at the right elbow.  She has limitations in  wrist motion bilaterally, and multiple joint deformities are noted in  both hands.   Her strength, however, upper and lower extremities, is in the 5/5 range.  No focal rigidity is appreciated.  Reflexes are generally diminished  throughout.   Balance is quite good.  Romberg's test is performed adequately.   IMPRESSION:  1. Right ankle synovitis overall improved since last month.  2. Chronic low back pain with history of lumbar compression fractures.  3. Mild symptoms of lumbar stenosis.  4. Bilateral hand pain secondary to rheumatoid arthritis.  5. Bilateral knee pain.  6. Anticoagulated.  7. On Methotrexate.  8. Nicotine addiction.   PLAN:  We will refill the following medications for her today, Lyrica 75  mg one p.o. b.i.d., #60, three refills and Percocet 10/325 q.i.d. p.r.n.  ankle, back, or right upper extremity pain, #120, no refills.   We will also have her follow up  with Physical Therapy to assess for  Lofstrand crutches.  The crutch she used to un-weight her right lower  extremity has caused her some axillary discomfort.  She is unable to  really bear weight through her right wrist and hand; therefore, I think  a Lofstrand crutch may be of some benefit to her.  We will see her back  in two months, a nursing visit next month for refill of her medications.  She seems stable on these medications.  She has not exhibited any  evidence of aberrant behavior.  She is getting fair relief with the  current meds that she is being treated with and is able to maintain an  independent functioning  lifestyle.           ______________________________  Brantley Stage, M.D.     DMK/MedQ  D:  01/24/2007 11:36:37  T:  01/24/2007 19:23:51  Job #:  [id number]

## 2010-11-30 NOTE — Assessment & Plan Note (Signed)
Beth [name] is a pleasant 60 year old woman who has a history of  rheumatoid arthritis.  She has been seen in our pain clinic for multiple  joint pain complaints.  Predominantly including low back, bilateral  wrist, left elbow, bilateral hand, right knee, and bilateral ankles and  feet.   She states that she has an appointment to see Dr. Kellie Simmering on April 2.   She is requesting refill of her medications today.   She states she has been in good health other than extremely achy joints  over the last month.   Average pain is about a 7 on a scale of 10, interfering significantly  with activities.  She is having her grandson help make her meals on  occasion, now.   She gets a little relief with current pain medication.  Pain is worse  with activities, improves with rest.   She is able to walk about 5 minutes at a time.  She is unable to climb  stairs.  She is able to drive.   Independent with self care for the most part with the exception of  higher level household tasks.   Admits to some depression, denies suicidal ideation, denies anxiety.   REVIEW OF SYSTEMS:  Otherwise noncontributory.   PAST MEDICAL/SOCIAL/FAMILY HISTORY:  Unchanged.  Continues to smoke a  pack of cigarettes a day.   MEDICATIONS PROVIDED BY OUR CLINIC:  1. Percocet 10/325 one p.o. q.i.d. p.r.n.  2. Lyrica 75 mg 1 p.o. b.i.d.  3. Phenergan 1/2 tablet to 1 tablet p.o. daily #30 per month.   PHYSICAL EXAMINATION:  Blood pressure is 118/81, pulse is 71,  respirations 18, 97% saturated on room air.  She is a well-developed, well-nourished elderly female who does not  appear in any distress.  She is oriented x3.  Speech is clear, affect is  bright.  She is alert, cooperative and pleasant.  She follows commands  without any difficulty.  Transitioning from sitting to standing is done slowly and carefully.  Gait in the room is slow and tentative.  Limitations are noted in lumbar motion in all planes.  Reflexes  are diminished in upper and lower extremities.  Her motor  strength is 5/5, nonfocal, although on manual muscle testing around  painful joints she is unable to give full effort.  She has tenderness in the left elbow.  She has tenderness with palpation  over bilateral wrists as well as bilateral MCP joints.  Her right PIP on  the index finger is tender.  Her PIPs on the left index, ring, and  middle finger are all tender.  Bilateral ankle joints are swollen and  tender, and she has tenderness throughout both feet as well.   IMPRESSION:  1. History of rheumatoid arthritis.  2. Mild symptoms of lumbar stenosis.  3. Anticoagulated.  4. Nicotine addiction.  5. Bilateral knee pain as well.   PLAN:  1. Will refill the following medications for her today:  Lyrica 75 mg      1 p.o. b.i.d. #60 three refills, promethazine 25 mg 1/2 to 1 tablet      p.o. daily #30 p.r.n. nausea #3 refills, and Percocet 10/325 one      p.o. q.i.d. p.r.n. ankle, hand, and elbow pain #120 no refills.  2. Will see her back in a month.  3. Will also have her set up for a TENS unit evaluation through      physical therapy department.  4. She has been stable on the  above medications, she does not display      any aberrant behavior, although she did forget to bring in her      bottles today.  I have reminded her that she needs to do this at      each visit.  She states she will comply.   Will see her back in a month.           ______________________________  Brantley Stage, M.D.     DMK/MedQ  D:  10/12/2007 10:33:19  T:  10/12/2007 11:24:17  Job #:  [id number]

## 2010-11-30 NOTE — Assessment & Plan Note (Signed)
Beth [name] is a 60 year old divorced female who is being seen in our  pain and rehabilitative clinic for chronic low back pain, as well as  multiple joint pain. She has a history of rheumatoid arthritis and is  back in our pain and rehabilitative clinic for a refill of her  medications. She continues to have hand pain, as well as low back pain.  Average pain is about an 8 on a scale of 10. Sleep tends to be poor to  fair. Pain is worse with activities and it improves with medication.   She can walk about 5 minutes at a time. She is independent with her self  care. She does have some trouble walking much longer than five minutes.  She complains of some shortness of breath. Otherwise, review of systems  is non-contributory.   PAST MEDICAL HISTORY:  Otherwise unchanged.   FAMILY HISTORY:  Otherwise unchanged.   SOCIAL HISTORY:  Otherwise unchanged.   Medications prescribed by our clinic include:  1. Promethazine p.r.n. for nausea 1/2 tablet.  2. Lyrica 75 mg 1 twice daily.  3. Percocet 10/325 mg up to 4 times daily.   PHYSICAL EXAMINATION:  VITAL SIGNS:  Blood pressure 94/59, pulse 76,  respirations 18, 96% saturated on room air.  GENERAL:  She is a well developed, well nourished female who appears her  stated age and does not appear in any distress.  She is oriented x3. Speech is clear. Affect is bright, alert,  cooperative, and pleasant. She follows commands without difficulty.   Transitioning from sit to stand is without difficulty. Her gait in the  room is low. Limitations are noted in lumbar motion in all plains.  Shoulder and cervical range of motion is also compromised.   She does complain of some intermittent numbness in her hands which has  gone on for a few months now. Intact sensation is noted today. Motor  strength is good in the upper and lower extremities without focal  deficit. She has multiple joint deformities in both hands, especially at  the metacarpal  phalangeal joint bilaterally.   IMPRESSION:  1. Right trochanteric bursitis which is intermittent; rheumatoid      arthritis with significant involvement of wrists and hands.  2. Mild symptoms of lumbar stenosis.  3. Anticoagulated.  4. On methotrexate.  5. Nicotine addiction.  6. Bilateral knee pain.   PLAN:  The patient has been given a prescription to follow up in  physical therapy for joint protection techniques, equipment evaluation,  wrist splints, fabrication, and assistive device evaluation. She was set  up for this month, however, she did not pursue it apparently. She states  that the physical therapy facility did not contact for an appointment.   Her Percocet is refilled for her today 10/325 mg 1 p.o. q.i.d., number  120 with no refills. She does not need refills on her Lyrica.  Promethazine was also filled, 25 mg, 1/2 tablet, 1 p.o. daily on a  p.r.n. basis for intermittent nausea.   We will see her back in a month. May consider elective diagnostic  studies to evaluate her hands further for carpal tunnel symptoms. I  encouraged her to maintain contact with her primary care physician as  well. She also noted toward the end of the visit that she has followed  up with Dr. Ferd Glassing who is possibly planning some sort of back  injection for her. She has had previous kyphoplasty by him prior. We  will see her back  in a month. She has been stable on these medications.  She does not exhibit any aberrant behavior and has had appropriate pill  counts. We will check a urine drug screen today.           ______________________________  Brantley Stage, M.D.     DMK/MedQ  D:  05/18/2007 13:09:19  T:  05/19/2007 07:40:27  Job #:  [id number]

## 2010-11-30 NOTE — Assessment & Plan Note (Signed)
Beth [name] is a very pleasant 60 year old divorced woman who is  followed in our [facility] for multiple chronic pain  complaints, which are related to her rheumatoid arthritis.  The joints,  which bother her the most are her wrist, occasional elbow, knee, and  ankle as well as low back.   She is also followed by Dr. Kellie Simmering who has her on prednisone as well as  methotrexate to help manage her rheumatoid arthritis.   She is back in today, requesting refill on her Lyrica and Percocet  today.   She states her average pain has been about 7 on a scale of 10; however,  she does get fairly good relief with current medications.   Her functional status is as follows.  She is able to walk at least 5  minutes.  She has difficulty with stairs.  She can drive and she is  independent with self care.  She denies depression, anxiety, or suicidal  ideation.  She denies problems controlling bowel or bladder.  Overall in  the last month, she has been healthy without any visits to the emergency  room, urgent care, or has been placed on any other new medicines.   No changes in social or family history.   PHYSICAL EXAMINATION:  VITAL SIGNS:  Blood pressure is 135/86, pulse 81,  respirations 18, and 96% saturated on room air.  GENERAL:  She is a well-developed, well-nourished woman who appears  older than her stated age.  She is oriented x3.  Speech is clear.  Affect is bright.  She is alert, cooperative, and pleasant.  She follows  commands without difficulty and she answers questions appropriately.   Cranial nerves II through XII are grossly intact as is coordination.  Motor strength is good in the upper and lower extremities without focal  deficits.  No new sensory deficits are appreciated.  Rotation, flexion,  extension, and lateral flexion of her cervical spine does not bother her  or increase pain.  She has just mild limitations with this.  She has  functional shoulder range  of motion bilaterally.  She does lack full  abduction on the left compared to the right.  She has about 100 degrees  of abduction on the left.  She has been elbow flexion contracture on the  left and is able to extend it to 145 degrees and on the right, she has  170 degrees of elbow extension.   She has limitations in ankle range of motion as well.  No tenderness was  noted in the cervical, thoracic, or lumbar spine with palpation.   Hands did not reveal any erythema or edema today and are essentially  nontender.   IMPRESSION:  1. Rheumatoid arthritis with multiple joint involvement including      elbows, wrists, and ankles predominantly.  2. Symptoms of spinal stenosis.  3. Anticoagulated.  4. Nicotine addiction.  5. Bilateral knee pain, which is really not a problem at this time.   Medical problems include diabetes and heart disease.   PLAN:  We will refill Percocet 10/325 one p.o. q.i.d. p.r.n. back,  ankle, wrist, or knee pain.   We will also refill her Lyrica 75 mg 1 p.o. b.i.d. with a refill.  We  will see her back in 2 months' nursing visit next month for refill of  her Percocet.   [name] has been taking her medications as prescribed.  She does  not exhibit any aberrant behavior with the use  and she reports overall  improvement in her function and pain with the use of this medication.  She is able to maintain a relatively functional lifestyle despite  significant impairment related to rheumatoid arthritis.           ______________________________  Brantley Stage, M.D.     DMK/MedQ  D:  07/02/2008 13:30:48  T:  07/03/2008 05:36:56  Job #:  [id number]

## 2010-11-30 NOTE — Assessment & Plan Note (Signed)
Beth [name] is a 60 year old woman who has a history of rheumatoid  arthritis.  She was last seen in our pain and rehabilitative clinic on  May 18, 2007.  In the interim, she has had a nursing visit for  refill of her Percocet.  In the interim medically she apparently has  been taken off of her methotrexate.  She states that she has been a bit  worse since then and has had to go on prednisone for the duration of 1  week a couple of weeks ago.  She, otherwise, has been medically stable,  has not had any major medical problems.  She did have a strain injury  from picking a frozen pot pie off the floor; however, she is recovering  from that.   Her average pain is about a 7, localized to bilateral wrists and hands.  Left elbow is bothering her somewhat as well.  Pain is worse with  activities, improves with rest as well as medication.  She is getting  fair relief from the medications at this time.   MEDICATIONS FROM OUR CLINIC:  1. Promethazine 25 mg half a tablet on a p.r.n. basis for nausea.  2. Lyrica 75 mg twice a day.  3. Percocet 10/325 up to 4 times a day.   She is independent with her self care, needs some assistance with higher  level activities, functional status.  Also, she is able to walk about 5  minutes at a time, has difficulty with stairs.  She is able to drive,  however.  She denies depression, anxiety, or suicidal ideation.  She  occasionally does have some dizziness and occasionally some nausea.   PAST MEDICAL HISTORY:  Otherwise noncontributory.   SOCIAL/FAMILY HISTORY:  Unchanged since last visit.   PHYSICAL EXAMINATION:  Blood pressure is 124/70, pulse 70, respirations  18, 98% saturated on room air.  She is a well-developed, well-nourished female who appears her stated  age.  She is oriented x3.  Her speech is clear, her affect is bright.  She is  alert, cooperative and pleasant, and she follows commands easily.  Transitioning from sitting to standing is  done with ease.  Gait in the  room is normal, balance is good.  Tandem gait is performed with a little  bit of difficulty; however, Romberg's test is performed adequately.  Limitations are noted in lumbar motion in all places.  Cervical range of  motion is mildly limited.  She has full shoulder range of motion on the  right and limitations are noted on the left to about 90 degrees of  abduction.  She has crepitus at both elbows, pain with pronation and supination on  the left elbow is noted.  She has limitations in wrist motion.  She is  able to extend her wrist about 50 degrees on the left and 65 degrees on  the right.  She has synovial thickening of the wrist joint bilaterally,  as well as the index finger MCP joint.   IMPRESSION:  1. History of rheumatoid arthritis, status post steroids this month      for 1 week.  Continues to use Percocet on a p.r.n. basis to help      with pain of the wrist and hands.  2. Mild symptoms of lumbar stenosis.  3. Anticoagulated.  4. Nicotine addiction.  5. Bilateral knee pain.   PLAN:  The patient is requesting followup with the rheumatologist.  Will  attempt to do this for her.  Will refill her Percocet 10/325 one p.o.  q.i.d. on a p.r.n. basis #120 no refills.  She has been taking this as  prescribed, does not  display any aberrant behavior.  She has not had any problems with over  sedation or constipation with it.  Will check a urine drug screen today  and will see her back in a month.           ______________________________  Brantley Stage, M.D.     DMK/MedQ  D:  08/15/2007 11:32:23  T:  08/15/2007 14:10:44  Job #:  [id number]

## 2010-11-30 NOTE — Assessment & Plan Note (Signed)
Beth [name] is a pleasant 61 year old divorced woman who is  followed in our [facility].  She has multiple chronic pain  complaints, which are related to her rheumatoid arthritis.  She has  bilateral wrist pain, elbow pain, knee pain and ankle pain as well as  low back pain.   She is also followed by Dr. Kellie Simmering and is currently treated with  methotrexate to help manage her rheumatoid arthritis.  She states that  he recently trial her on Plaquenil, which made her quite nauseated and  caused a good deal of vomiting.  She apparently was unable to tolerate  the medication and has discontinued it.  She states he will follow her  back up and may consider another medication, but will not restart her  Plaquenil.   Due to insurance reasons, her Lyrica has been discontinued.  She is  currently not interested in starting on Neurontin at this time, but may  consider it at another time, since she is just been so sick with  Plaquenil, she really does not want to start any new meds at this time.   She tolerates her Percocet well, but is asking for something to help her  sleep at night, a little better than she has been.   Her average pain is about 8 on the scale of 10.  Predominant problems  today are right wrist, bilateral ankles, and low back.  Pain is worse  with activity in general, improves with medication.  She gets fairly  good relief with current medications.   MEDICATIONS:  Provided by this clinic include the following,  1. Percocet 10/325 up to 4 times a day.  2. Lyrica discontinued by [employer].  3. Phenergan 25 mg 1/2 tablet to 1 p.o. daily and p.r.n.   FUNCTIONAL STATUS:  As follows.  She is able to walk about 5 minutes at  a time.  She is able to climb stairs.  She has difficulty with stairs.  She is independent with feeding, dressing, bathing, and toileting, also  helps out with cooking, washing dishes, and folding laundry.  She has  difficulty lifting  any heavy objects however.   She states that she is interested in possibly obtaining some part-time  work doing some sitting, sitting for an elderly couple.[employer]   REVIEW OF SYSTEMS:  The patient denies problems controlling bowel or  bladder.  Denies harm to self or others.  Denies depression or anxiety.   Otherwise noncontributory and negative.   No changes in past medical, social, or family history since last visit,  other than that previously mentioned.   PHYSICAL EXAMINATION:  Blood pressure is 132/82, pulse 76, respiration  18, and 96% saturated on room air.   She is well-developed, mildly obese woman who appears her stated age and  does not appear in any distress.  She is oriented x3.  Speech is clear.  Affect is bright.  She is alert, cooperative, and pleasant.  She follows  commands without any difficulty.  Answers questions appropriately.   Cranial nerves and coordination are grossly intact.  Her reflexes are  diminished in the lower extremities.  Sensation was not evaluated today.  Her motor strength is 5/5 in the upper as well as lower extremities  without focal deficit.  She has limitations in joint range of motion at  the elbows as well as the wrist.  She has some tenderness and warmth at  both ankles today.  Full range of motion  is noted at the knee.  Ankle  range of motion is 50 degrees, still 120 degrees bilaterally.  She has  full shoulder range of motion and slightly diminished range of motion in  the neck with rotation to the right.   Gait is nonantalgic.  Tandem gait.  Romberg test are performed  adequately.   IMPRESSION:  1. Rheumatoid arthritis with multiple joint involvement including      elbows, wrists, and ankles predominantly.  2. Symptoms of spinal stenosis.  3. Anticoagulated.  4. Nicotine addiction.  5. Medical problems include diabetes and heart disease.   PLAN:  We will trial her on Ambien 5 mg not more than 10 tablets per  month, Percocet 10/325  up to 4 times a day 120, no refills on either of  these, and Phenergan 25 mg one-half to one full tablet a day for nausea,  #30 with 2 refills.   We will see her back in 2 months', nursing visit next month for refill  of her medications.   [name] has been stable on the above medications, the Ambien will  be new for her.  She will get not more than 10 tablets per month.   She has been taking her medications as prescribed and no aberrant  behavior has been exhibited by her with respect to her narcotics.   She is able to maintain a relatively functional lifestyle despite some  significant impairments in pain due to her rheumatoid arthritis and in  fact she is even considering a part-time job.           ______________________________  Brantley Stage, M.D.     DMK/MedQ  D:  08/29/2008 11:48:15  T:  08/30/2008 00:49:19  Job #:  [id number]   cc:   Aundra Dubin, M.D.  [address]  [place]  [place] [address]

## 2010-11-30 NOTE — Assessment & Plan Note (Signed)
[name] is a 60 year old woman who is followed in our Pain and  Rehabilitative clinic for multiple pain complaints.  She has a history  of rheumatoid arthritis and has had multiple joints involved including  elbows, wrist, ankles, and low back.  She is currently also followed by  Dr. Kellie Simmering who is managing her rheumatoid arthritis with methotrexate  and prednisone.   At the last visit, she had complaints of some mouth ulcers, and Dr.  Kellie Simmering apparently has placed her on some oral rinse.  Overall she is  doing well, reports overall improvement of any bruising and diarrhea.  She apparently has some blood due in August.   With respect of our clinic, her pain is well controlled.  Her average  pain is about 6 on a scale of 10.  She is happy with current pain  management medications.   She denies any problems with complications from these medicines which  could include oversedation, constipation, and swelling; however, she has  been having none of these problems currently.   Her pain is mainly localized today to the low back.  She also has some  milder bilateral ankle pain.  Pain is typically worse with increased  activities, improves with her medications.   She can walk currently about 5 minutes at a time.  She is able to drive.  She has difficulty with stairs.  She is independent with her self-care,  and she is currently helping take care of her ex-husband who has  Alzheimer's and is requiring quite a bit of supervision and sometimes  some physical help as well.  This is stressing her out somewhat.   No new changes noted on review of systems today.   Past medical, social, family history are also unchanged other than that  noted above.   MEDICATIONS:  Provided by this clinic include;  1. Percocet 10/325 up to four times a day.  She had 17 tablets from      last month.  She is about 4 days early in her recheck due to      scheduling.  2. Lyrica 75 mg 1 p.o. b.i.d.  3.  Phenergan 25 mg 1-1/2 tablets 2 p.o. every evening.   PHYSICAL EXAMINATION:  VITAL SIGNS:  Blood pressure is 146/76, pulse 69,  respirations 20, and 97% saturate on room air.  GENERAL:  She is a well-developed, well-nourished, mildly obese female  who appears her stated age.  She is oriented x3.  Speech is clear.  Affect is bright.  She is alert, cooperative, and pleasant.  She follows  commands easily.  Cranial nerves are grossly intact.  Coordination is  intact.  Reflexes are diminished in the lower extremities.  No clonus is  noted.  Sensation is intact.  MUSCULOSKELETAL:  Full range of motion at the elbows without tenderness.  Wrists are without tenderness today.  She does not have tender joints in  her fingers.  Knees are nontender.  She does have some mild tenderness  about both ankles and some thickening around the ankles as well.  Forward flexion and extension bother her lumbar spine somewhat.  She  does have limited motion with respect to her lumbar mobility.  She has  full cervical range of motion without pain today.  She has full shoulder  range of motion without pain.   IMPRESSION:  1. History of rheumatoid arthritis with multiple joint involvement in      the past including elbows, wrist, and ankles predominantly.  2. Symptoms of lumbar stenosis/spondylosis.  3. Anticoagulated.  4. Nicotine addiction.  5. History of bilateral knee pain.   Medical problems at this time also include diabetes, history of heart  disease.   We will refill the following medications for her today; Lyrica 75 mg 1  p.o. b.i.d. #60; no evidence of any edema, no problems with  oversedation; and we will also refill her Percocet 10/325 one p.o.  q.i.d. p.r.n. ankle or back pain #120.   She takes her Percocet as prescribed.  She does not display any aberrant  behavior.  No problems associated with this medicine such as  oversedation or constipation, and she has appropriate pill counts.  We  will  have nursing visit to see her next month for refill of her  medications.  I will see her back in 2 months.  She has been stable on  the above medications without complications.           ______________________________  Brantley Stage, M.D.     DMK/MedQ  D:  02/06/2008 09:48:55  T:  02/06/2008 23:46:01  Job #:  [id number]   cc:   Aundra Dubin, M.D.  [address]  [place]  [place] [address]

## 2010-11-30 NOTE — Assessment & Plan Note (Signed)
[name] is a 60 year old divorced woman who is followed in  our [facility] for chronic pain complaints, which  are related to her rheumatoid arthritis.  In the past, she has had  problems with bilateral elbows, wrists, hands, and ankles as well as low  back.  She is back in today for refill of her medications.  She is  taking Percocet up to 4 times a day as well as Lyrica 75 mg twice a day.   She is also followed by Dr. Kellie Simmering, who has helped manage her  rheumatoid arthritis.   Beth [name] states she had followup with the [facility] in  the last month for bronchitis.  She was placed on erythromycin,  Tussionex, and an inhaler, and her symptoms have almost resolved per  her.   Overall, she is feeling well.  Her average is about a 6 on a scale of  10.  Moderately interfering with her activity levels.  Her bilateral  elbows, wrists, and hands are really not bothering her today.  Her low  back is somewhat of a problem as well as bilateral ankles, which are  somewhat stiff and achy; however, much improved from the last visit.   Pain is described in these areas as constant and aching.  Sleep is fair.  Pain improves with medication.  She gets good relief with current meds.   FUNCTIONAL STATUS:  She is able to walk at least 5 minutes at a time.  She has difficulty climbing stairs.  She is able to drive.  She is  independent with self-care.   She does admit to depression.  Denies suicidal ideation.   REVIEW OF SYSTEMS:  Negative with the exception of some abdominal  complaints.  I asked her to follow up with primary care for these  complaints.   Past medical, social or family history otherwise unchanged.   MEDICATIONS:  Prescribed through this clinic include;  1. Percocet 10/325 q.i.d. p.r.n.  2. Lyrica 75 mg twice a day.  3. Phenergan half of a 25-mg tablet on a p.r.n. basis.   PHYSICAL EXAMINATION:  VITAL SIGNS:  Blood pressure is 111/71,  pulse 70,  respirations 18, and 96% saturated on room air.  GENERAL:  She is a well-developed, mildly obese woman, who appears her  stated age, does not appear in any distress.   She is oriented x3.  Speech is clear.  Affect is bright.  She is alert,  cooperative, and pleasant.  Follows command without any difficulty.   NEURO:  Cranial nerves are grossly intact.  Coordination is intact.  Reflexes are diminished in the lower extremities.  Motor strength in the  5/5 range without focal deficit in the lower extremities.   She is able to transition from sitting to standing easily today.  Her  gait in the room is nonantalgic.   She has limitations in lumbar range of motion in all planes.  She has  rather good cervical range of motion with rotation right and left with  dorsiflexion and extension without pain.  Shoulders' abduction on the  left is 145 and on the right is about 170.  She lacks full extension at  the elbows on the left 145 degrees versus right 170 degrees.  Ankles, on  the right, she has 80 degrees of dorsiflexion and 150 degrees of plantar  flexion on the left.  She has mild thoracic kyphosis, which is  nontender.   Very minimal tenderness in  the ankles.  She does have synovial  thickening; however, no edema or erythema is associated with this today.   IMPRESSION:  1. Rheumatoid arthritis with multiple joint involvement in the past      including elbows, wrists, and ankles predominantly.  2. Symptoms of lumbar spinal stenosis.  3. Anticoagulated.  4. Nicotine addiction.  5. Bilateral knee pain, currently not a problem at this time.   MEDICAL PROBLEMS:  Include diabetes and history of heart disease.   PLAN:  We will refill Percocet 10/325 one p.o. q.i.d. p.r.n. back and  ankle pain, #120, and Lyrica 75 mg 1 p.o. b.i.d. with 2 refills.   I encouraged her to follow up with primary care for her medical problems  as well as Dr. Kellie Simmering for managing her rheumatoid  arthritis.  I will  see her back in 2 months with nursing visit next month for refill of her  pain medications.   Beth [name] has been stable on these medications.  She takes them as  prescribed.  She does not exhibit any aberrant behavior with their use,  her pill counts are appropriate, and she is able maintain relatively  functional lifestyle despite significant impairment related to her  rheumatoid arthritis.   Discussion today is when she is doing better, she should probably  decrease her narcotic load by dropping one pill or half a pill when she  can.  We will see her back in 2 months and nursing visit next month.           ______________________________  Brantley Stage, M.D.     DMK/MedQ  D:  05/07/2008 10:05:06  T:  05/08/2008 01:14:50  Job #:  [id number]   cc:   Aundra Dubin, M.D.  [address]  [place]  [place] [address]

## 2010-11-30 NOTE — Assessment & Plan Note (Signed)
Beth [name] is a 60 year old divorced woman who is followed in our [facility] for chronic pain complaints related to her  rheumatoid arthritis.  She was last seen by me on February 06, 2008.  Today,  she has had interim room visit with nursing staff for refill of her  medications and brief recheck on April 05, 2008.  She is back in  today and states her average pain is about 7 on a scale of 10.  She has  been quite stressed over the last month.  She has been having some  problems with her ex-husband who has significant dementia and has had  trouble with nursing home placement for him.   Her sleep overall is fair.  She is getting good relief with current  medications.  Pain is mainly localized to her ankles today.  She does  have some mild discomfort in the right elbow with motion however.  She  states her hands and wrist are doing quite well.  She has a small area  over her right lateral distal tibia, which is mildly erythematous not  particularly painful however.   She states she has been sick with viral upper respiratory infection  about a week and a half ago as well.  Otherwise, she has been relatively  healthy since our last visit.   FUNCTIONAL STATUS:  She is able to walk at least 5 minutes at a time.  She has difficulty climbing stairs.  She does drive.  She is independent  with self care also helps out with meal prep.  She admits to some mild  depression.  Denies suicidal ideation.  Denies problems controlling  bowel or bladder.  Denies any new numbness, tingling, or weakness.   REVIEW OF SYSTEMS:  Otherwise noncontributory.   No other changes in past medical, surgical, social or family history  other than that previously mentioned.   MEDICATIONS:  Provided through this clinic include;  1. Percocet 10/325 up to 4 times a day.  2. Lyrica 75 mg twice a day.  3. Phenergan 25 mg half tablet to 1 tablet on a p.r.n. basis daily.   PHYSICAL EXAMINATION:   VITAL SIGNS:  Blood pressure is 132/80, pulse 61,  respirations 18, and 97% saturate on room air.  GENERAL:  She is a well-developed, well-nourished female who appears her  stated age with some truncal obesity.  She is oriented x3.  Speech is  clear.  Affect is bright.  She is alert, cooperative and pleasant.  Follows commands without any difficulty.   Cranial nerves are grossly intact.  Coordination is intact.  Reflexes  are diminished in lower extremities. Sensation is intact.  Gait reveals  antalgic gait with decreased weightbearing in the left lower extremity.  Short stride length bilaterally is noted as well.  Gait is overall  stable however.   MUSCULOSKELETAL:  Reveals mild tenderness in the right elbow, wrist, and  metacarpophalangeal joints are without significant tenderness today.  Knees are without tenderness today.  She does have synovitis in  bilateral ankle joints.  Tenderness is noted at both MTP joints  bilaterally.  She does have an area of mild erythema just in the region  of the distal right tibia lateral to the midline and slightly warm and  slightly erythematous, very mild tenderness.  Her left ankle is much  more tender and especially in the MTP joint.   IMPRESSION:  1. Rheumatoid arthritis with multiple joint involvement in the past  including elbows, wrist, and ankles predominantly.  2. Symptoms of lumbar spinal stenosis.  3. Anticoagulated.  4. Nicotine addiction.  5. Bilateral knee pain currently not a big problem at this time.   MEDICAL PROBLEMS:  Diabetes and history of heart disease.   PLAN:  We refill her Percocet 10/325 one p.o. q.i.d. p.r.n. ankle pain  #120 two months.  She does not need a refill on her Lyrica today.  She  does takes 75 mg twice a day.   I also would like her to follow up with her primary care Dr. Kellie Simmering  regarding her right ankle.  She is immunocompromise being on  methotrexate as well as prednisone and she is diabetic.  All  these  things certainly do put at a higher risk for infection.  She, in the  past, has had synovitis and erythematous areas; however should this  worsen, I have been emphasized to her she does need to follow up with  her primary care doctor.   We will see her back in a month.  She has been stable on the Percocet  prescribed to this clinic as well as the Lyrica.  She has not had any  significant side effects from either of these medication.  She takes her  Percocet as prescribed.  Her pill counts were appropriate.  No aberrant  behavior as noted with the patient.  She remains relatively functional  despite some significant impairments secondary to her rheumatoid  arthritis.           ______________________________  Brantley Stage, M.D.     DMK/MedQ  D:  04/04/2008 12:11:03  T:  04/05/2008 06:46:09  Job #:  [id number]   cc:   Aundra Dubin, M.D.  [address]  [place]  [place] [address]

## 2010-11-30 NOTE — Assessment & Plan Note (Signed)
[name] is a pleasant 60 year old woman who has followed in  our [facility] for multiple chronic pain complaints  which are mainly related to her rheumatoid arthritis.  She has had  bilateral wrist pain, elbow pain, knee pain, ankle pain, as well as low  back pain.   Predominant complaint today are bilateral ankle discomfort.   She has not seen Dr. Kellie [name] in the last month.  She continues to use  methotrexate.   In the last month, she had an episode of stomach by risk which  apparently other members of her family have had.  She had some nausea,  vomiting, and diarrhea about 2 weeks ago which has now resolved.   Average pain is about 6 on a scale of 10.  Sleep is fair.  Pain is  typically worse with activities.  Improves with medications.   MEDICATIONS:  Provided through this clinic include;  1. Percocet 10/325 up to 4 times a day p.r.n.  2. Phenergan one half to one full tablet for nausea.  3. Neurontin 300 mg 1 or 2 tablets at night.   FUNCTIONAL STATUS:  She is able to walk about 5 minutes at a time.  She  has difficulty with stairs.  She is able to drive.  She is independent  with self-care and does some higher level household tasks as well.  She  is active socially, intends to go to her grandson's play tonight.   REVIEW OF SYSTEMS:  Negative for problems controlling bowel or bladder,  numbness, tingling, tremors.  Reports occasionally back spasms.  Denies  depression, anxiety or suicidal ideation, and also reports recent nausea  and vomiting as described earlier in this note.   No changes in the past medical, social or family history.   PRIMARY CARE PHYSICIAN:  At [facility].  She has no specific  physician there.   On exam today; blood pressure is 130/77, pulse 81, respirations 18, and  96% saturated on room air.  She is a well-developed, well-nourished  female who appears her stated age and does not appear in any distress.  She  is oriented x3.  Speech is clear.  Affect is bright.  She is alert,  cooperative, and pleasant.  Follows commands without difficulty, answers  my questions appropriately.   Cranial nerves are intact.  Coordination is intact as well.  Reflexes  are diminished in the lower extremities.  No abnormal tone is noted.  No  clonus is noted.  No tremors are appreciated.   Sensation is intact in the upper and lower extremities.  Cervical and  lumbar dermatomes are tested without abnormality noted.   She is able to transition easily from sitting to standing.  Tandem gait.  Romberg test are performed adequately today.  She has some limitations  in cervical range of motion which are mild.  She has limitations in  shoulder range of motion to about 90 degrees bilaterally.  She has  limitations in lumbar motion.  Elbow joints are nonerythematous and  nontender today.  Metacarpal phalangeal joints are without tenderness  and without erythema today as well.  She does have some tenderness in  the bilateral ankle joints.   IMPRESSION:  1. Rheumatoid arthritis with multiple joint involvement, main joints      currently involved are bilateral ankles.  Elbows are improved and      wrist are improved, as well as metacarpal phalangeal joints are      significantly  improved as well today.  2. Symptoms of spinal stenosis.  3. Anticoagulated.  4. Nicotine addiction.  5. Diabetes and heart disease which are managed by primary care.   PLAN:  Refill Percocet 10/325 up to 4 times a day.  We will refill  Phenergan 25 mg half tablet to full tablet p.r.n., Neurontin 300 mg 1-2  p.o. nightly #60 with a couple of refills.  We will have a nursing visit  over the next 2 months for refill of medications and monitoring pill  counts.  I will see her back in 2 months.  [name] is stable on the  above medications.  She takes them as prescribed.  No aberrant behavior  has been noted.  No significant constipation or  oversedation has been  appreciated with the use of these medications as well.            ______________________________  Brantley Stage, M.D.     DMK/MedQ  D:  11/21/2008 11:42:42  T:  11/22/2008 01:38:36  Job #:  [id number]   cc:   [facility]

## 2010-11-30 NOTE — Assessment & Plan Note (Signed)
Beth [name] is a pleasant 60 year old woman who is followed in  our [facility] for chronic pain complaints which are  related to her rheumatoid arthritis.  She has a history of bilateral  wrist pain; elbow pain, especially on the left; bilateral knee pain;  bilateral ankle pain, worse on the left than on the right; as well as  low back pain.   Main complaint today are right ankle pain and low back pain.   She indicates to me today that she missed 3 of her appointments with Dr.  Kellie Simmering and was discharged from his office.  She has not taken  methotrexate now for 2 months.   She is going to follow up with her primary care doctor to see if she can  get referred to another rheumatologist.   She is back in today for refill of her pain medications.  Her average  pain is about 7 on a scale of 10, worse towards the end of the day.  Sleep is fair.  Pain is typically worse with activities.  Improves with  rest, medication and pacing her activities.  She has fair relief with  current meds.   FUNCTIONAL STATUS:  She can walk 10 minutes at a time.  She does not  climb stairs.  She is able to drive.  She is independent with self-care.  She denies problems controlling bowel or bladder.  Denies suicidal  ideation at this time.  Does have a history of some depression.   She has occasional problems with nausea, vomiting.  No constipation at  this time.  Past medical, social, family history otherwise unchanged.   MEDICATIONS:  Which are prescribed through this clinic include;  1. Percocet 10/325 up to 4 per day.  2. Phenergan p.r.n.  3. Neurontin 300 mg 1-2 at night.   Exam; blood pressure is 121/76, pulse 69, respirations 18, 96% saturated  on room air.  She is a well developed mildly obese female who does not  appear in any distress.  She is oriented x3.  Speech is clear.  Affect  is bright.  She is alert, cooperative and pleasant.  Follows commands  without  difficulty, answers my questions appropriately.   Cranial nerves coordination are intact.  Reflexes are 2+ patellar  tendons, 2+ upper extremities, diminished at the ankles bilaterally.  No  sensory deficits are noted with light touch today.  Motor strength is  good throughout.  She has skin changes over her metacarpal phalangeal  joints on the right.  Her left elbow has contracture about 20 degrees.  No nodules are noted today.  She has quite a bit of synovitis in both  ankles, left worse than right, although her right ankle seems to be more  tender than her left one today.   Transitions easily from sitting to standing.  Tandem gait is performed  adequately.  Romberg test is performed adequately, mild limitations,  cervical motion are noted.  No pain with this at all today.  Shoulder  range of motion is limited to about 90 degrees bilaterally with  abduction.   IMPRESSION:  1. Rheumatoid arthritis with multiple joints involved.  Main joints      involved currently are bilateral ankles, elbows and wrists, without      significant tenderness at this time.  2. Symptoms of spinal stenosis in the past.  3. Anticoagulated.  4. Nicotine addiction.  5. History of diabetes and heart disease.  This will be managed  by      primary care.   PLAN:  I asked her to follow back up with primary care for rheumatologic  referral, also consider bone density at some point given history of  steroid use.  We will refill her Percocet 10/325, not more than 4 per  day, #120 per month.   Pill counts are appropriate.  She takes her medications as prescribed.  No aberrant behavior has been observed.           ______________________________  Brantley Stage, M.D.     DMK/MedQ  D:  02/06/2009 12:05:40  T:  02/07/2009 01:11:53  Job #:  [id number]   cc:   Dala Dock

## 2010-11-30 NOTE — Assessment & Plan Note (Signed)
[name] is a 60 year old divorced female who is being seen  in our pain and rehabilitative clinic for back pain and multiple joint  pain.  She has history of rheumatoid arthritis and is back in our pain  and rehabilitative clinic today for a refill of her medications.   She states that she has been healthy over the last month, no  hospitalizations or emergency room visits, no new medications reported  by the patient.   She has complaints of increased wrist pain, especially on the right and  increased hand pain as well.   She states that she has been using a cane in the right hand for  ambulation lately.  She also has been writing in a journal more and  attributes her increased pain to these activities.   Average pain is about a 6 on a scale of 10, today it is about an 8.  She  describes her pain as aching.  Pain is typically worse in the morning  and with activities, improves with rest, medication and she gets fair to  good relief with her current medications.   She is independent with her self-care, she denies problems controlling  bowel or bladder.  Denies depression or anxiety or suicidal ideation.   REVIEW OF SYSTEMS:  Noncontributory.   Past medical, social, family history not contributory today.  No changes  are noted by the patient.   MEDICATIONS PROVIDED BY THIS CLINIC:  1. Promethazine which she takes on a p.r.n. basis 1/2 tablet p.o.      daily.  2. Lyrica 75 mg one p.o. twice a day.  3. Percocet 10/325 up to 4 times a day p.r.n. joint pain.   EXAMINATION:  Her blood pressure is 130/78, pulse 88, respirations 18,  97% saturated on room air.  She is well-developed, slightly obese female  who does not appear in any distress.  She is oriented x3, her speech is clear, her affect is bright and alert,  she is cooperative and pleasant and she follows commands without  difficulty.  She transitions from sitting to standing without difficulty.  Her gait  in the  room is slightly antalgic, she has complaints of right hip pain  as she ambulates.  She has some difficulty with tandem gait but is able  to perform a Romberg's test without problems.  Her reflexes are diminished in the upper and lower extremities.  Her  motor strength however is good, nonfocal, 5/5 strength at hip flexors,  knee extensors, dorsiflexors, plantar flexors, EHL, 5/5 at the shoulder  abductors, biceps, triceps, brachioradialis, finger flexors and  intrinsics.  She is unable to give full effort with respect to right  wrist dorsiflexion secondary to pain at this point and limited motion.  She has tenderness with palpitation of dorsum of both wrists, more so on  the right than on the left and she has multiple MCP joint swelling  bilaterally as well as tenderness.  Right hip is tender over the trochanter.   IMPRESSION:  1. Mild right trochanteric bursitis.  2. Rheumatoid arthritis with significant involvement of wrists and      hands today with swelling and increased pain.  3. Mild symptoms of lumbar stenosis.  4. Anticoagulated.  5. On methotrexate.  6. Nicotine addiction.  7. Intermittent bilateral knee pain.   Patient has been using a cane which has exacerbated some pain in her  right wrist and hand.  We would like her to followup with therapist for  an assistive device evaluation.  She may do better with a Lofstrand type  crutch rather than a straight cane.  Would like them to evaluate her.   Would also like occupational therapy to review joint protection  techniques and assess for any adaptive equipment which would allow her  to participate in functional activities or avocational activities.   We will refill her Percocet today 10/325 one p.o. q.i.d. p.r.n. joint  pain #120, no refills.  She does not need refills on Lyrica or  promethazine.  She has been using her narcotics as directed, she has not  displayed any aberrant behavior and is getting fair to good relief  with  these medications allowing her to engage in relatively independent  lifestyle.   Will see her back in 1 month.           ______________________________  Brantley Stage, M.D.     DMK/MedQ  D:  03/22/2007 09:55:33  T:  03/22/2007 10:50:44  Job #:  [id number]   cc:   Kerby Nora, MD

## 2010-11-30 NOTE — Assessment & Plan Note (Signed)
[name] is a 60 year old woman who is followed in our [facility] who has a 20-year history of rheumatoid  arthritis.  She is back in today for refill of her pain medication.  She  states overall she has been walking better and is able to do more.  She  states she has a longer stride now, and she has been able to get out to  shop at places such as [employer], and she is looking forward to shopping  for some flowers as well.   She has been followed recently by Dr. Kellie Simmering who has started her back  on methotrexate.  She states she is doing so much better now that she is  back on that medication.  Her wrists, hands, and ankles are much  improved.  Her pain in the clinic today is about 5 on a scale of 10.  Prior to her restarting methotrexate, her pain scores were about 8 on a  scale of 10.   Pain is typically worse when she is up and active, improves with rest  and medications.  She reports good relief with current meds provided by  this clinic.   FUNCTIONAL STATUS:  She is able to walk about 5 minutes at a time.  She  has difficulty with stairs.  She is able to drive.  She is independent  with her self care.  She denies problems controlling bowel or bladder.   She denies depression or anxiety.  She denies suicidal ideation.   REVIEW OF SYSTEMS:  Otherwise negative.   PAST MEDICAL, SOCIAL, AND FAMILY HISTORY:  Unchanged since last visit.   MEDICATIONS:  Provided by this clinic include,  1. Percocet 10/325 up to 4 times a day.  Pill counts were appropriate      today.  2. Lyrica 75 mg twice a day.  3. Phenergan 25 mg a half tablet to a whole tablet daily for nausea.   PHYSICAL EXAMINATION:  Her blood pressure is 138/77, pulse 69,  respirations 18, and 97% saturated on room air.She is a well-developed,  well-nourished woman who appears her stated age and does not appear in  any distress.  She is oriented x3.  Her speech is clear.  Her affect is  bright.   She is alert, cooperative, and pleasant.  She follows commands  easily.   Transitioning from sitting to standing is done with ease today.  Gait in  the room is not antalgic.  Stride length is much improved.  Tandem gait  and Romberg's test are performed adequately.   She has near full range of motion in her cervical spine.  She has some  limitations with range of motion of the left shoulder.   Reflexes are slightly diminished at the brachioradialis compared to  biceps and triceps, intact at the patellar tendons, and decreased at the  Achillis tendons bilaterally.   Motor strength is good in both upper extremities without focal deficits.   Overall improved range of motion at elbows.   Ankles have some pannus, but no acute tenderness is noted.   IMPRESSION:  1. History of rheumatoid arthritis with joint involvement of the      elbows, wrists, and ankles predominantly, overall improved.  2. Mild symptoms of the lumbar stenosis.  3. Anticoagulant.  4. Nicotine addiction.  5. Bilateral knee pain, overall improved as well.   We will decrease her Norco today from 10/325 down to 7.5/325, #120 per  month.  She  does not need refill on Lyrica or Phenergan.   She has been taking her pain medication as prescribed and no aberrant  behavior has been appreciated.  We will see her back in a month.  I will  go ahead and order cervical radiographs again, may consider MRI with  history of continued left arm pain, which radiates from the elbow to the  index finger and thumb on the left hand.           ______________________________  Brantley Stage, M.D.     DMK/MedQ  D:  12/12/2007 09:43:59  T:  12/13/2007 00:18:30  Job #:  [id number]

## 2010-11-30 NOTE — Assessment & Plan Note (Signed)
[name] is a 60 year old divorced female who is being  followed in our [facility] for multiple pain  complaints.  She has a history of chronic low back pain, as well as  multiple joint pain.  She has a history of rheumatoid arthritis and has  over the last month undergone some occupational therapy for some hand  symptoms.  She had had some intermittent numbness and tingling and  bilateral hand splints were given to her, as well as some education on  joint protection techniques.  She also underwent some modalities to help  improve her hand pain.   She is back in today and states her average pain is about a 7 on a scale  of 10.  She, in the interim, states that she has had a flareup in her  left elbow.  She believes this is possibly related to discontinuation of  her methotrexate.  She has a new primary care physician and has been off  her methotrexate now for a little over a month.  She has not noticed the  flareup in her hands and left elbow since discontinuing it.   She states she has attempted to have the pharmacy call it back in for  her; however, has been without success.   She reports between fair and good control of her pain with current  medications provided by our clinic.  She sleeps fairly well.  Her  biggest complaint currently is left elbow pain and decreased range of  motion and hand stiffness.   No change in her self-care or mobility since last visit.  No changes in  her review of systems.  She does have some intermittent nausea and  vomiting which is taken care of with intermittent promethazine.   No other changes in her past medical, social or family history since our  last visit.   Medications provided by our clinic include Lyrica 75 mg 1 p.o. b.i.d.  #60 per month, Percocet 10/325 up to 4 times a day #120 per month and  promethazine half a tablet daily on a p.r.n. basis.   On exam today blood pressure is 133/79, pulse 78,  respiration 18, 95%  saturated on room air.  She is a well-developed, well-nourished female  who does not appear in any distress.  She is oriented x3.  Her speech is  clear.  Her affect is bright.  She is alert, cooperative and pleasant.  She follows commands without any problems.   Transitioning from sit to stand is done without difficulty.  Her gait in  the room is stable.  Romberg test is performed adequately.  She has some  trouble with tandem gait, however.   Limitations are noted in lumbar range of motion in all planes lacking  50% of normal motion.   Her left elbow lacks.  She is able to extend it to about 140 degrees and  she does have some discomfort in the elbow in a generalized  distribution.   There is no increased redness or warmth of the joint, however.   She has rheumatoid changes in both hands, multiple joints are involved,  which is unchanged from previous months.   Motor strength is in the 5/5 range.  No numbness is noted in the hands  today, although she states it does come and go.  Overall improved.   Reflexes are 1+ in the upper and lower extremities.  No abnormal tone is  noted.  No clonus is noted.  IMPRESSION:  1. Left elbow pain and decreased range of motion.  2. Rheumatoid arthritis, significant involvement of wrists and hands.  3. Mild symptoms of lumbar stenosis.  4. Anticoagulated.  5. On methotrexate.  6. Nicotine addiction.  7. Bilateral knee pain.   PLAN:  I have offered to obtain appointment with her primary care  physician today to get her in for evaluation regarding her left elbow  and to assess the need for methotrexate.  [name] defers this,  would like to call on her own.   She will continue to use wrist splints on a p.r.n. basis for wrist pain.  She has been stable on the above medications, takes them appropriately.  No aberrant behavior has been observed.  She is getting fairly good  relief with the medicines as well.  Will  refill the following  medications:  Her promethazine 25 mg one-half tablet p.o. daily p.r.n.  nausea #3 with 1 refill; Percocet 10/325 one p.o. q.i.d. p.r.n. hand,  elbow pain or back pain #120 with no refills and Lyrica 75 mg 1 p.o.  b.i.d. #60 with 3 refills.   Will see her back in 2 months.  Nursing visit next month for refill of  her Percocet.  Again, encouraged her to follow up with her primary care  physician.  [name] is also interested in looking into purchase of  Paraffin hand bath.           ______________________________  Brantley Stage, M.D.     DMK/MedQ  D:  06/18/2007 10:44:07  T:  06/18/2007 11:53:53  Job #:  [id number]

## 2010-11-30 NOTE — Assessment & Plan Note (Signed)
[name] is a pleasant 60 year old woman who is followed in  our [facility] for multiple chronic pain complaints  which are mainly related to her rheumatoid arthritis.  She has bilateral  wrist pain, elbow pain, knee pain, and ankle pain, as well as low back  pain.   Her predominant complaints today are right MCP joint pain and swelling  as well as bilateral ankle swelling and pain.   She states that Dr. Kellie Simmering has been working with her on getting her on  some medications to help manage her arthritis a little better; however,  the last medications made her too sick to take.  She states she will be  calling him today to see if there is something else he can trial with  her.   She states she is not sleeping well at night.  Her feet bother her.  The  Lyrica had been helping quite a bit with her rest at night; however, it  has been discontinued, she has been having problems sleeping and she is  wondering if there is something else she might take.  She tried Ambien,  however, that is not working out particularly well for her.   She does have a history of coronary artery disease and status post 5  stents on a beta-blocker and Accupril.   Her average pain in her hands and ankles at this time is about 7 on a  scale of 10, on the average can go up to a 9.  Pain is fairly constant  and aching in nature, worse with walking and standing, improves with  medications.  She gets good relief with current meds at this time.   FUNCTIONAL STATUS:  She is able to walk about 5 minutes at a time.  She  has difficulty with stairs.  She is able to drive and she did drive here  this morning.  She is able to take care of her ADLs independently at  this point.   REVIEW OF SYSTEMS:  Otherwise negative other than trouble sleeping at  night due to pain.   Past medical, social, family history otherwise unchanged.  She does  continue to smoke a half pack of cigarettes a day.   MEDICATIONS PROVIDED THROUGH THIS CLINIC:  1. Percocet 10/325 up to 4 times a day.  2. Phenergan 25 mg half tablet to one tablet p.r.n. for nausea.  3. Ambien was trialed last month 5 mg not more than 10 tablets were      taken with.  She reported no significant improvement in her sleep      with the Ambien.   PHYSICAL EXAMINATION:  VITAL SIGNS:  Blood pressure is 146/90, pulse 88,  respirations 18, 97% saturated on room air.  GENERAL:  She is well-developed, well-nourished, mildly obese woman who  appears her stated age and does not appear in any distress.   She is oriented x3.  Her speech is clear.  Affect is bright.  She is  alert, cooperative, and pleasant.  Follows commands without difficulty  and answers my questions appropriately.   Cranial nerves and coordination are intact.  Her reflexes are diminished  in both upper and lower extremities.  No abnormal tone is noted.  No  clonus is noted.  No tremors are appreciated.   No new sensory deficits are noted.   Limitations in the cervical and lumbar range are appreciated as well as  range of motion at the wrist and ankle.  She has some synovitis over the  right MCP joint especially the second and third are particularly tender.  Her ankles are also swollen and tender as well today.   Transitioning from sitting to standing is done without difficulty.  However, she is taking very short stride length and ambulating rather  slowly today due to ankle pain.   IMPRESSION:  1. Rheumatoid arthritis with multiple joint involvement.  Main joints      involved today are right second and third metacarpophalangeal      joints as well as bilateral ankles.  Elbows are not as bad today.      Wrists are not as bad today as well.  2. Symptoms of spinal stenosis.  3. Anticoagulated.  4. Nicotine addiction.  5. Medical problems include diabetes and heart disease.   PLAN:  Discontinue Ambien.  Trial Neurontin 300 mg one to two p.o. at  bedtime.   We will refill her Percocet 10/325 one p.o. q.i.d. p.r.n. pain  in ankles and hands #120, no refills.  Pill counts have been  appropriate.  No evidence of aberrant behavior has been noted with her.  She reports overall good relief with her pain medications and is able to  perform independent ADLs and live a relatively independent lifestyle  despite significant symptoms related to her rheumatoid arthritis and  chronic pain.  We will see her back in a month.  I have also asked her  to follow up with Dr. Kellie Simmering.           ______________________________  Brantley Stage, M.D.     DMK/MedQ  D:  10/24/2008 09:09:48  T:  10/24/2008 22:22:13  Job #:  [id number]   cc:   Aundra Dubin, M.D.  [address]  [place]  [place] [address]

## 2010-11-30 NOTE — Assessment & Plan Note (Signed)
[name] is a 60 year old woman who is followed in our pain  and rehabilitative clinic who has a history of rheumatoid arthritis.  She has been seen in our pain clinic for pain medications for multiple  joint complaints, including low back, bilateral wrists, left elbow,  bilateral hand, right knee, bilateral ankles and feet.   She recently has been followed by Dr. Kellie Simmering.  He has been placed back  on methotrexate, as well as prednisone.  Over the last several weeks  now, she reports overall improvement in her pain.  She states her  average pain is about 6 now on a scale of 10.  It is aching in nature.  Her elbows have now improved.  Her ankles do not bother her.  Her knee  is not bothering her.  Really, the only areas that are somewhat sore for  her are her right wrist and hand, and mildly so in her left wrist.  She  reports her elbows are 110% better, and she says her ankles are feeling  pretty good as well.   Functionally, she has improved her activity level and states that for  the first time in about 8 months, she cooked dinner.  She is independent  with her self-care now.  She denies problems controlling bowel or  bladder.  She denies depression, anxiety or suicidal ideation.   She is able to walk about 5 minutes at a time.  Pain is typically worse  with activities, especially walking and using her hands, but improves  with rest and medication.   REVIEW OF SYSTEMS:  Noncontributory at this time.   PAST MEDICAL HISTORY:  Remarkable for new medications added by Dr.  Kellie Simmering, which include prednisone, as well as methotrexate.  Her primary  care physician recently started her on metformin as well.   SOCIAL HISTORY:  Pertinent for continued smoking 1/2 pack of cigarettes  per day.   FAMILY HISTORY:  No other change in the family history.   PHYSICAL EXAMINATION:  VITAL SIGNS:  Blood pressure is 156/76, pulse 70,  respirations 18 with 95% saturation on room air.  GENERAL:  She is a well-developed, well-nourished female who does not  appear in any distress.  She is oriented x3.  Her affect is bright.  She  is alert, cooperative and pleasant.  She is smiling this morning and was  doing a little dance in her chair because she felt so much better.  EXTREMITIES.  She is able to transition from sitting to standing easily  today.  Her gait in the room is stable, non-antalgic.  Tandem gait and  Romberg's test are performed today and done adequately so.   She has overall improvement in joint range of motion in her elbows  without pain today.  Ankles are not bothering her nor are they inflamed.  She has good strength at the elbows, biceps, as well as triceps.  She  does have some tenderness around the right wrist and in the hand yet,  however, significant decrease in swelling, especially in the elbows  today are noted.   She has good strength in the lower extremities with 5/5 at hip flexors,  knee extensors, dorsiflexors, plantar flexors, EHL.  The ankles are no  longer swollen.  No significant tenderness is noted at the ankles as  well.   IMPRESSION:  1. History of rheumatoid arthritis.  2. Mild symptoms of lumbar stenosis.  3. Anticoagulated.  4. Nicotine addiction.  5. Bilateral knee pain,  overall seems to be improved.   The patient is overall much improved since starting the methotrexate, as  well as prednisone.  I believe she will be able to start decreasing her  Percocet as well.  She will try to take just a 3 a month over the next  month.  We will refill her today 10/325 Percocet 1 p.o. b.i.d. to  q.i.d., #120.  She does not need refill on her Lyrica nor her Phenergan  at this time.  I will see her back in 48-month.           ______________________________  Brantley Stage, M.D.     DMK/MedQ  D:  11/12/2007 10:02:53  T:  11/12/2007 10:33:48  Job #:  [id number]   cc:   Aundra Dubin, M.D.  [address]  [place]  [place]  [address]

## 2010-11-30 NOTE — Assessment & Plan Note (Signed)
HISTORY OF PRESENT ILLNESS:  Beth [name] is a 60 year old woman who is  followed in our [facility].  She has a 20-year  history of rheumatoid arthritis and is currently followed by Dr. Kellie Simmering  who has started her on methotrexate a couple of months ago.  She has  been overall doing very well with respect to her joint pain since  starting the methotrexate.  However, she comes in to our clinic today  and states that she has had some problems with mouth ulcers, diarrhea,  and reports some bruising on her left upper extremity.  She feels she is  bruising a bit easier than usual.   She was switched to Norco last month.  She states that the Percocet does  work better for her and she would like to switch back to [employer].   Average pain is about 7 on a scale of 10.  Sleep is fair.  Pain is worse  with activities, which require her to use her hands and wrist a bit  more.  Pain improves with medications.  She is getting less relief this  month and with Percocet, her relief is between a little and fair at this  point.   Walking is limited to about 5 minutes.  She has difficulty with stairs.  She is independent, however, with her self-care.   No changes in bowel or bladder habits.  No problems with balance.  No  new weakness, numbness, or tingling.  Denies depression, anxiety, or  suicidal ideation.   REVIEW OF SYSTEMS:  Pertinent for the nausea, diarrhea, mouth ulcers,  and new bruising apparently.   Past medical, social, and family history are otherwise unchanged.   MEDICATIONS:  Prescribed by this clinic include:  1. Norco 10/325 up to 4 times a day.  2. Lyrica 75 mg twice a day.  3. Phenergan 25 mg half a tablet for nausea.   PHYSICAL EXAMINATION:  VITAL SIGNS:  Blood pressure is 120/66, pulse 84,  respiration 18, and 96% saturated on room air.  GENERAL:  She is well-developed, well-nourished female who does not  appear in any distress.  She is oriented x3.   Speech is clear.  Affect  is bright.  She is alert, cooperative, and pleasant.  She follows  commands without difficulty.   Cranial nerves are grossly intact.  Motor strength is 5/5 in upper and  lower extremities without focal deficits.  Reflexes are 1-2+ in the  upper extremities, diminished in the lower extremities.  No abnormal  tone is noted.  No clonus is noted.  Sensation is intact to light touch  in upper and lower extremities.  Coordination is intact.  Gait is  stable.  Tandem gait and she does have a bit of trouble with, Romberg  test is performed adequately.   Review of musculoskeletal system reveals some joint thickening around  both wrists as well as ankles.  She has tenderness especially at the  left wrist and at the left knee.  Her motion is diminished at both  wrists, is quite functional around the left knee as well as both ankles.   There is no edema.  Extremities are warm.  She does have a 3 cm x 4 cm  bruise over the upper left arm where she hit her arm on the bathroom  door.   IMPRESSION:  1. History of rheumatoid arthritis with multiple joints involved      including elbows, wrists, and ankles predominantly.  2.  Symptoms of lumbar stenosis.  3. Anticoagulated.  4. Nicotine addiction.  5. Bilateral knee pain.  6. Medical problems include diabetes, history of heart disease, and      more recently some complaints of mouth ulcers, diarrhea, and easy      bruising.  I have asked her to follow up with Dr. Kellie Simmering for these      recent symptoms.  She states she will give his clinic a call.  She      does have an appointment with him in the next few weeks.  However,      I would like her to call his clinic sooner to let them know of her      more recent symptoms.  She states she will comply with my      recommendations.  I did order some cervical radiographs for the      last visit, she has not followed through with obtaining these.  She      states she will do so in  the next month, however.  We will see her      back next month for evaluation, pill count, and refill of her pain      medications.  She has been stable on the above medications.  She      was trialed on hydrocodone.  She did not do as well as she did with      Percocet.  We will switch her back to Percocet today.  Prescription      for this was written 10/325, 1 p.o. q.i.d. p.r.n. joint pain, #120,      no refills.           ______________________________  Brantley Stage, M.D.     DMK/MedQ  D:  01/11/2008 09:19:43  T:  01/11/2008 23:24:13  Job #:  [id number]

## 2010-11-30 NOTE — Assessment & Plan Note (Signed)
Beth [name] is a pleasant 61 year old woman who has history of  rheumatoid arthritis.  She is being seen in our clinic for a refill of  her pain medication today.   She is still frustrated about being off of methotrexate for several  months now.  She has increased pain in her hands, her wrist, and  especially her left elbow.  She had some swelling in her left elbow  since the methotrexate has been discontinued.   She also complains of pain in her ankles.   She states her average pain is about a 7 on a scale of 10.  In clinic  today, it is about an 8.  Pain is described as aching, worse with  activity, improves with medication, and she is getting fairly good  relief with the current meds prescribed by our clinic.   MEDICATIONS FROM OUR CLINIC:  Include:  1. Percocet 10/325 one p.o. q.i.d. p.r.n.  2. Lyrica 75 mg 1 p.o. b.i.d.  3. Phenergan 1/2 tablet p.o. twice a day.   Her functional status is as follows:  She is able to walk 5 minutes at a  time.  Her goal is to maintain her current level of function.  She does  not climb stairs.  She is able to drive.  She is independent with her  self-care and some higher level household activities.   Denies problems controlling bowel or bladder.  Denies depression,  anxiety, or suicidal ideation.  Denies any problems with respect to  review of systems.   PAST MEDICAL, SOCIAL, AND FAMILY HISTORY:  Unchanged since our last  visit.  She states that she is looking for a new primary care doctor at  this point.   EXAM:  Today, her blood pressure is 123/82.  Pulse 70.  Respirations 18,  95% saturated on room air.  She is a well-developed, well-nourished  female who appears her stated age.  She is oriented x3.  Her speech is  clear.  Her affect is bright.  She is alert, cooperative, and pleasant.  She follows commands without any difficulty.   She is able to transition from sitting to standing slowly.  Her gait in  the room is stable.   Evaluation of her upper extremities today reveals a left elbow effusion.  She has thickening and diminished range of motion in both wrists.  She  has some mild tenderness over her metacarpal phalangeal joints of the  3rd digit on the right.   Ankles are tender bilaterally.   IMPRESSION:  1. History of rheumatoid arthritis status post steroids last month for      1 week.  Continues to use Percocet on a p.r.n. basis to help manage      pain in wrists, hands, left elbow, and ankles.  2. Mild symptoms of lumbar stenosis.  3. Anticoagulated.  4. Nicotine addiction.  5. Bilateral knee pain as well.   PLAN:  Call over to Dr. Ines Bloomer office today regarding possible  followup for rheumatologic symptoms.  We will send notes and any other  information they may need.  Beth [name] is requesting to be placed back  on methotrexate as she had been earlier last year.  She felt her  function was much better.  Pain overall much better while she was on  methotrexate.  We will send copies of our notes over to Dr. Ines Bloomer  office.  Phone call to them today was done as well.  We will go ahead  and  refill her medications from our perspective, Percocet 10/325 one  p.o. q.i.d. p.r.n. elbow pain, hand pain, foot pain, wrist pain, #120,  no refills.  We will see her back in a month for refills of her pain  medication.  Of note, I am not requesting rheumatology take over this  aspect of her treatment, specifically I am looking for rheumatology to  monitor her rheumatologic symptoms and treat from their standpoint.  We  will see her back in 4 to 5 weeks.   She has been taking her medication as prescribed.  She does not exhibit  any aberrant behavior and she is able to maintain a relatively  functional lifestyle despite some limitations.           ______________________________  Brantley Stage, M.D.     DMK/MedQ  D:  09/12/2007 13:08:52  T:  09/13/2007 09:40:03  Job #:  [id number]

## 2010-12-03 NOTE — Consult Note (Signed)
NAMEARVIS, MIGUEZ             ACCOUNT NO.:  [id number]   MEDICAL RECORD NO.:  [id number]          PATIENT TYPE:  OUT   LOCATION:  XRAY                         FACILITY:  [facility]   PHYSICIAN:  Sanjeev K. Deveshwar, M.D.DATE OF BIRTH:  07-10-1951   DATE OF CONSULTATION:  02/25/2005  DATE OF DISCHARGE:                                   CONSULTATION   BRIEF HISTORY:  Beth [name] is a 60 year old female with a history of  rheumatoid arthritis, osteoporosis, diabetes mellitus, and gastroesophageal  reflux disease.  She has been seen by Dr. Corliss Skains on multiple occasions.  In May of this year, she had kyphoplasty procedures at the T12 and L3  levels. She subsequently developed more pain. She had a followup MRI on February 05, 2005 revealed new compression fractures at the L2 and L4 level.  She  underwent kyphoplasties at the L2 and L4 level on February 11, 2005.  The  patient has continued to have severe pain in her back as well as from her  rheumatoid arthritis.  She has been on Percocet for quite sometime.  She  contacted [place] on February 16, 2005 and stated that she was running out of her  Percocet and requested a new prescription.  We provided her with a  prescription for 50 Percocet at that time.  Apparently she contacted her  primary care physician at some point thereafter and requested more Percocet.  Her primary care physician was unavailable and the covering physician  prescribed 20 Percocet for her at that time.  The patient is seen back in  the office today in followup.  She states she continues to have severe pain  from her arthritis and in her back. She rates her pain as a 7 on a 1-10  scale.  She feels her pain is somewhat better and she is more mobile since  having the kyphoplasty procedures although she feels that the continue back  pain in combination with the rheumatoid arthritis is overwhelming.   Apparently she has been set up to see a rheumatologist for further treatment  of  her rheumatoid arthritis.  Dr. Corliss Skains suggested that she have a repeat  MRI to rule out the possibility of a new compression fracture.  However, the  patient did not want to proceed with the MRI at this time.  She wanted to  wait several more weeks to see if her pain would continue to improve.  Dr.  Corliss Skains instructed her to call us if she wanted to schedule the MRI or if  her pain was not improving.  We have instructed the patient to follow up  with her primary care physician for further refills of her pain medications.  A referral to a pain specialist may be consideration in the future as well.   It should be noted that greater than 30 minutes was spent on this consult  today.      Markus.Osmond   DR/MEDQ  D:  02/25/2005  T:  02/25/2005  Job:  [id number]   cc:   Beth Nora, MD  Fax: [phone]

## 2010-12-03 NOTE — Discharge Summary (Signed)
[name], [name] NO.:  [id number]   MEDICAL RECORD NO.:  [id number]          PATIENT TYPE:  INP   LOCATION:  6524                         FACILITY:  [facility]   PHYSICIAN:  Melina Fiddler, MD DATE OF BIRTH:  Feb 18, 1951   DATE OF ADMISSION:  03/29/2005  DATE OF DISCHARGE:  04/02/2005                                 DISCHARGE SUMMARY   DIAGNOSES:  1.  Acute coronary syndrome.  2.  Diabetes mellitus.  3.  Coronary artery disease.  4.  Hyperlipidemia.  5.  Smoking.  6.  Gastroesophageal reflux disease.  7.  Arthritis.   PROCEDURES:  1.  March 30, 2005, cardiac catheterization with stent placement x2.  2.  April 01, 2005, cardiac catheterization with stent placement x2.  3.  Echocardiogram on March 31, 2005, showed a normal ejection fraction      of 55-65% with no wall motion abnormalities.   MEDICATIONS:  1.  Zocor 80 mg daily.  2.  Diclofenac 75 mg p.o. b.i.d.  3.  Glucotrol 10 mg p.o. b.i.d.  4.  Lantus 20 units subcutaneous every evening.  5.  Protonix 40 mg p.o. daily.  6.  Percocet 5/325 mg two tablets every six hours as needed for pain.  She      was given 60.   HOSPITAL COURSE:  The patient was admitted on March 29, 2005, with chest  pain.  At the time it was atypical; however, she had risk factors including  diabetes, previous stent placement, smoking, hyperlipidemia, so she was  watched overnight.  Her cardiac enzymes were stable for the first two non-  point of care sets.  The rest of her laboratory analysis was also within  normal limits.  We got a cardiology consult in the morning, and they  immediately felt that she was an appropriate candidate for cardiac  catheterization.  She was taken there expediently and she got two stents and  for one blockage that was 95%, the other one was greater than 80%.  One was  the PLA and the other one was the right circumflex.  At that time it was  also noted that she had 80% blockage in  two other coronary arteries,  including the left circumflex and the left anterior descending.  She was  arranged to have a second cardiac catheterization on April 01, 2005,  which three stents were placed in the LAD and LCX.  Her ejection fraction  was calculated to be within normal limits, and echocardiogram was also  normal.  At the time during hospitalization, she was counseled to quit  smoking and at discharge she was going to attempt to quit.  We increased her  Lantus from 10 to 20 units due to high blood sugars and a hemoglobin A1c of  8.9.  Her cardiac enzymes did increase after the stent.  This could be  related to acute coronary syndrome during or just before catheterization or  it could be just from the catheterization itself.   Important admission labs:  BNP of 254.  Hemoglobin A1c of 8.9.   Problem 1.  CORONARY  ARTERY DISEASE/ACUTE CORONARY SYNDROME:  The patient  had greater than 80% blockage of for coronary arteries.  These were fixed  via stent placement during two consecutive coronary catheterizations.  She  remained asymptomatic after the first cardiac catheterization.   Problem 2.  DIABETES MELLITUS:  The patient was increased from Lantus 10  units to Lantus 20 units because of increased blood sugars in the hospital  and a hemoglobin A1c of 8.9.   Problem 3.  HYPERLIPIDEMIA:  The patient was started on Zocor 80 mg daily as  she had an LDL in the outpatient of 122 and given her risk factors, her LDL  goal was at least below 100, perhaps below 70.   Problem 4.  SMOKING:  The patient had stopped smoking at discharge.   Problem 5.  ARTHRITIS:  The patient will continue Diclofenac and Percocet  for pain.   Problem 6.  GASTROESOPHAGEAL REFLUX DISEASE:  The patient will continue  Protonix 40 mg daily.   DISCHARGE LABORATORY DATA:  Hemoglobin 12.1, hematocrit 35.3, white count  5.9, platelets 309.  Sodium 138, potassium 3.8, BUN 9, creatinine 0.7,  chloride 107,  bicarb 24, glucose 161.   FOLLOW-UP:  The patient will follow up with her physician, Kerby Nora,  M.D., on April 05, 2005, at 9 o'clock in the morning.      Angeline Slim, M.D.    ______________________________  Melina Fiddler, MD    AL/MEDQ  D:  04/01/2005  T:  04/02/2005  Job:  [id number]   cc:   Kerby Nora, MD  Fax: [phone]

## 2010-12-03 NOTE — Group Therapy Note (Signed)
Beth [name] is a 60 year old divorced white female who lives with  her daughter and grandchildren.  She is referred by Dr. Ermalene Searing for pain  management.   [name] has a long history of arthritis and rheumatoid  arthritis, which was not managed for many years secondary to apparent  financial constraints.   She has recently followed up with Dr. Coral Spikes, who has established her on  some methotrexate and recommended steroids  until methotrexate is improving  her condition overall.   She currently is followed by Dr. Ermalene Searing, who has been treating her with  oxycodone and recently has written her a prescription for fentanyl, which  [name] has apparently attempted to fill; however, the pharmacy did not  continue that particular medication.  However, [name] states she is  somewhat reluctant at this point to take a long-acting narcotic.   [name] worst pain is localized to her lower thoracic and low back  area, lower lumbar area, with some radiation into the posterior thighs;  however, the thigh pain is not a major problem for her.  She also has  exquisite pain in her left foot.  She apparently has fractured the fifth  digit on her left foot accidentally.  It is swollen and tender currently.   She has multiple joints which are painful for her, including bilateral  elbows, bilateral wrists and multiple finger joints, bilateral knees.   Her pain is typically worse in the morning and toward the evening and  somewhat into the night.  Pain is worse with activity, improves with rest  and medication.  Pain is described as fairly constant but it waxes and wanes  in nature.  Described as sharp, aching and burning.   She can walk approximately five minutes at a time.  She is able to climb  stairs.  She is able to drive.  She is independent with her self-care, needs  some assistance with meal prep, household duties and shopping.   Health and history form are  reviewed.  She admits to weakness, trouble  walking, spasms, dizziness and depression.   Physicians currently involved in her care include Dr. Ermalene Searing, Dr. Coral Spikes.   PAST MEDICAL HISTORY:  1.  Positive for coronary artery disease, status post stent x5.  2.  Currently anticoagulated.  3.  History of palpitations.  4.  Diabetes type 2 x10-12 years, has been on insulin approximately six      months.  5.  Osteoporosis.   PAST SURGICAL HISTORY:  1.  Appendectomy in 1991.  2.  TAH, age 60.  3.  Vertebroplasty at L2 and L4, Dr. Corliss Skains, July 2006.   SOCIAL HISTORY:  Positive for smoking half a pack of cigarettes a day.  Denies drug or alcohol use.  Lives with her daughter and her grandchildren.   FAMILY HISTORY:  Positive, mother died at age 59 of COPD.  Father died at  age 16 of heart disease.  Diabetes in four siblings.   PHYSICAL EXAMINATION:  VITAL SIGNS:  Blood pressure 123/75, pulse 64,  respirations 16, 99% saturated on room air.  GENERAL:  She is a well-developed, mildly obese female who appears her  stated age.  She is oriented x3.  Her affect is bright, alert.  She is  cooperative and pleasant.  MUSCULOSKELETAL/NEUROLOGIC:  She is able to stand easily after being seated.  Her gait is antalgic.  She wears a wooden shoe to protect her left little  toe as she walks.  It is noted to be swollen and bruised.   She has limitations in cervical range, all planes.  Limitations in lumbar  range, especially forward flexion-extension, very little motion, as well as  lateral flexion.  She has full shoulder range of motion.  She has flexion  contractures at both elbows approximately 10-15 degrees, limited motion in  bilateral wrists.  Deformity noted throughout both hands, especially the  distal interphalangeal joints; however, they are nontender with palpation.  Elbow is tender to palpation along the radial head and posteriorly along the  olecranon.  Knees have full range of motion.   Ankles have limited motion and  extensive synovitis anteriorly.   Reflexes are diminished throughout on both upper and lower extremities.  No  abnormal tone is noted, no clonus is noted.  She has good strength  throughout both upper and lower extremities, no focal weakness is noted.  She has exquisite tenderness in the lower thoracic and upper lumbar spines;  in fact, lightly touching her brings her to tears today.  She has some  erythema noted over the same area in the right lower thoracic and lumbar  paraspinal musculature.   IMPRESSION:  1.  Rheumatoid arthritis.  2.  Thoracolumbar compression fracture, status post vertebroplasty x2, L2      and L4, Dr. Corliss Skains.  3.  Osteoporosis.  4.  Lumbago.  5.  Diabetes mellitus.  6.  Coronary artery disease, status post stents x5.  7.  Anticoagulated on Coumadin.   PLAN:  The patient is currently not comfortable starting a long-acting  narcotic.  Will treat her pain with oxycodone with two tablets in the  morning, one in the middle of the afternoon and two at night.  Will also add  Lyrica in the evening.  Will trail her with Lidoderm as well as a TENS unit.  Would like to get her set up in occupational therapy for education in joint  protection techniques.  Have attempted to get hold of the primary care, Dr.  Kerby Nora.  Am unable to reach her today.  Would consider re-imaging study  in light of her exquisite tenderness over the lower thoracic-lumbar spine.  Would like to find out whether there have been some previous imaging studies  recently.   Will see her back in a month.  Also discussed the case briefly with Dr.  Corliss Skains.           ______________________________  Brantley Stage, M.D.     DMK/MedQ  D:  08/29/2005 13:53:00  T:  08/30/2005 07:23:33  Job #:  [id number]

## 2010-12-03 NOTE — Cardiovascular Report (Signed)
Beth [name], DWIGHT NO.:  [id number]   MEDICAL RECORD NO.:  [id number]          PATIENT TYPE:  INP   LOCATION:  2003                         FACILITY:  [facility]   PHYSICIAN:  Madaline Savage, M.D.DATE OF BIRTH:  [birth year]   DATE OF PROCEDURE:  04/01/2005  DATE OF DISCHARGE:                              CARDIAC CATHETERIZATION   PROCEDURES PERFORMED:  1.  Direct coronary stenting of the mid circumflex coronary artery.  2.  Direct coronary stenting of the mid left anterior descending.  3.  Cutting balloon angioplasty of the proximal and mid left anterior      descending.   COMPLICATIONS:  None.   ENTRY SITE:  Right femoral approach.   DYE USED:  Omnipaque.   MEDICATIONS GIVEN:  Angiomax resulting in an ACT of 343 seconds.   PATIENT PROFILE:  Patient is a 60 year old tobacco smoker who has previously  undergone percutaneous stenting of her right coronary artery and  posterolateral branch as well done by Dr. Allyson Sabal on March 30, 2005.  She  had presented with unstable angina.  She had a staged procedure today to the  vessels named above because of tight stenoses and tolerated her procedure  well using Angiomax for the procedure and sedatives including fentanyl and  Versed.  Procedure was performed by the right percutaneous femoral approach.  Guiding catheter 6-French XB3.5 guidewire Prowater cutting balloon 2.75 x 10  and 3 x 15 CYPHER stent 2.5 x 13 to mid LAD and mini Vision stent 2.5 x 12  to the circumflex.  Both of the stent balloons were deployed to maximal  inflation pressures short of rated burst pressure and all three lesions  responded nicely to their interventions.  Mid LAD was reduced from 80% to 0  with a 2.5 x 13 CYPHER.  Proximal and mid LAD was reduced from 75 to 10 with  a 3 cutting balloon.  Mid circumflex was reduced from 90 to 0 with a 2.25 x  12 mm mini Vision stent.  No complications occurred.  The patient tolerated  the procedure well  and left the catheterization laboratory with stable vital  signs.           ______________________________  Madaline Savage, M.D.     WHG/MEDQ  D:  04/01/2005  T:  04/01/2005  Job:  [id number]   cc:   Nanetta Batty, M.D.  Fax: [phone]   Cath Lab

## 2010-12-03 NOTE — H&P (Signed)
Beth [name], Beth NO.:  [id number]   MEDICAL RECORD NO.:  [id number]          PATIENT TYPE:  INP   LOCATION:  1823                         FACILITY:  [facility]   PHYSICIAN:  Melina Fiddler, MD DATE OF BIRTH:  03-10-1951   DATE OF ADMISSION:  03/29/2005  DATE OF DISCHARGE:                                HISTORY & PHYSICAL   CHIEF COMPLAINT:  Chest pain.   HISTORY OF PRESENT ILLNESS:  This is a 60 year old female seen, on March 27, 2005, at [facility] with chest pain, was supposed to be  admitted for ruled out but left AMA.  The chest pain had improved, status  post treatment at [facility] but it came on again at rest,  substernal, constant, lasting several hours, associated with shortness of  breath.  No nausea.  No diaphoresis.  The patient with MI history in 1997,  status post angioplasty x2.  She is also a smoker and has had cholesterol  and diabetes.   REVIEW OF SYSTEMS:  Negative for fevers, chills, nausea, vomiting, diarrhea,  constipation, dysuria, or rashes.  Positive for chest pain, shortness of  breath.   PAST MEDICAL HISTORY:  1.  Diabetes.  2.  Tobacco abuse.  3.  GERD.  4.  Osteoarthritis.  5.  Rheumatoid arthritis.  6.  Hyperlipidemia.  7.  History of gastritis.  8.  She is status post Cardiolite in 2001,  showing anterior ischemia.  9.  Status post MI in 1997, with angioplasty.  10. MRI, May 2006, showed compression fracture at T12 and 3.  11. She had a full hysterectomy in July 2006.   ALLERGIES:  CODEINE.   MEDICATIONS:  1.  Diclofenac 75 mg p.o. b.i.d.  2.  Glucotrol 10 mg p.o. b.i.d.  3.  Lantus 10 units q.h.s.  4.  Nitroglycerin 0.4 p.r.n.  5.  Pepcid over the counter two tabs p.o. every day.  6.  Percocet 5/325 mg p.o. q.6h.   PHYSICAL EXAMINATION:  VITAL SIGNS:  98.7, 129 to 176 over 87 to 101, heart  rate 95 to 100, respirations 22 to 26, 97 to 100% on room air.  GENERAL:  This is a Caucasian  female in no acute distress.  HEENT:  Extraocular muscles intact.  Glasses on.  Pupils equally round  reactive to light.  Moist mucous membranes.  LUNGS:  Clear to auscultation bilaterally.  HEART:  Regular rate and rhythm.  No murmurs, rubs, or gallops.  ABDOMEN:  Soft, nontender, nondistended.  EXTREMITIES:  Chronic arthritis, worse in the right hand versus left,  elbows, and feet.  Decreased range of motion.  Peripheral pulses 2+.  NEUROLOGIC:  Cranial nerves II-XII intact.  SKIN:  No rashes.  No lesions.  No bruising.   EKG:  Normal sinus rhythm.  Point-of-care enzymes negative x1.  B-MET:  Potassium 3.8, sodium 137, creatinine 0.6, and glucose is 240.  Hemoglobin  6.7.   ASSESSMENT/PLAN:  This is a 60 year old female with:  1.  Chest pain, atypical as it came on at rest.  The patient with numerous  risk factors.  She is status post myocardial infarction with      angioplasty, diabetes, and hyperlipidemia; therefore, we will admit for      rule out with cardiac enzymes.  Her EKG was within normal limits.  She      is receiving morphine for pain.  The patient is on a nitroglycerin drip,      therefore, must be admitted to a stepdown bed as the patient is still      with chest pain.  We will titrate to keep the patient pain free and at      the same time monitoring blood pressures.  2.  Diabetes.  Consider hemoglobin A1c.  Continue Glucotrol and Lantus      sliding scale and an American Diabetic Association diet.  3.  High cholesterol.  Start Crestor 10 mg by mouth q.h.s.  4.  Osteoarthritis, rheumatoid arthritis.  Continue Percocet and Diclofenac.  5.  Gastroesophageal reflux disease.  Protonix 40 mg orally daily.  6.  Tobacco, pre-contemplative.  Consider a patch.  7.  Deep vein thrombosis prophylaxis.  Sequential compression devices.  We      may change to Lovenox.  Hopefully the patient will be out of bed and      ambulating early.      Tawnya Crook Erenest Rasher, M.D.     ______________________________  Melina Fiddler, MD    MBV/MEDQ  D:  03/29/2005  T:  03/29/2005  Job:  [id number]

## 2010-12-10 ENCOUNTER — Encounter
Payer: Medicaid Other | Attending: Physical Medicine and Rehabilitation | Admitting: Physical Medicine and Rehabilitation

## 2010-12-10 DIAGNOSIS — M069 Rheumatoid arthritis, unspecified: Secondary | ICD-10-CM | POA: Insufficient documentation

## 2010-12-10 DIAGNOSIS — G894 Chronic pain syndrome: Secondary | ICD-10-CM

## 2010-12-10 DIAGNOSIS — Z79899 Other long term (current) drug therapy: Secondary | ICD-10-CM | POA: Insufficient documentation

## 2010-12-10 DIAGNOSIS — I2510 Atherosclerotic heart disease of native coronary artery without angina pectoris: Secondary | ICD-10-CM | POA: Insufficient documentation

## 2010-12-10 DIAGNOSIS — J4489 Other specified chronic obstructive pulmonary disease: Secondary | ICD-10-CM | POA: Insufficient documentation

## 2010-12-10 DIAGNOSIS — I252 Old myocardial infarction: Secondary | ICD-10-CM | POA: Insufficient documentation

## 2010-12-10 DIAGNOSIS — G8929 Other chronic pain: Secondary | ICD-10-CM | POA: Insufficient documentation

## 2010-12-10 DIAGNOSIS — M545 Low back pain, unspecified: Secondary | ICD-10-CM | POA: Insufficient documentation

## 2010-12-10 DIAGNOSIS — I4891 Unspecified atrial fibrillation: Secondary | ICD-10-CM | POA: Insufficient documentation

## 2010-12-10 DIAGNOSIS — M4800 Spinal stenosis, site unspecified: Secondary | ICD-10-CM | POA: Insufficient documentation

## 2010-12-10 DIAGNOSIS — M810 Age-related osteoporosis without current pathological fracture: Secondary | ICD-10-CM | POA: Insufficient documentation

## 2010-12-10 DIAGNOSIS — J449 Chronic obstructive pulmonary disease, unspecified: Secondary | ICD-10-CM | POA: Insufficient documentation

## 2010-12-10 DIAGNOSIS — F17200 Nicotine dependence, unspecified, uncomplicated: Secondary | ICD-10-CM | POA: Insufficient documentation

## 2010-12-10 DIAGNOSIS — E119 Type 2 diabetes mellitus without complications: Secondary | ICD-10-CM | POA: Insufficient documentation

## 2010-12-10 DIAGNOSIS — E7800 Pure hypercholesterolemia, unspecified: Secondary | ICD-10-CM | POA: Insufficient documentation

## 2010-12-11 NOTE — Assessment & Plan Note (Signed)
[name] is a pleasant 60 year old woman who is followed here at our [facility] for chronic pain complaints related to rheumatoid arthritis involving multiple joints, especially wrists, elbows and ankles.  She is also followed by Dr. Lanell Matar over at [facility], who currently has her on Enbrel and methotrexate.  She tells me that he may be considering switching these medicines in the upcoming months.  She also had some problems with some cyst over her wrists and the dorsum of her right foot and rheumatologist is planning to drain these as well.  She also has a history of multiple vertebroplasty, osteoporosis, chronic low back pain.  She has mild spinal stenosis.  She has limited ambulation capacity due to all the above problems.  She is back in today requesting a refill of her pain medications.  She has been using gabapentin 300 mg at 6:00 a.m. and 12:00 p.m., 5:00 p.m., 3:00 a.m. and 2 at bedtime as well as Percocet one and a half tablets in the morning, 1 tablet at noon and 6:00 p.m., and one and a half tablet at 10:00 p.m.  With these medications, she reports fair relief.  Her average pain is about 8 on a scale of 10.  Sleep is fair.  Pain is worse with activities.  Improves with medication.  FUNCTIONAL STATUS:  She can walk about 5 minutes.  She does use a cane. She has difficulty with stairs.  She does drive.  She is independent with feeding, dressing, bathing, toileting, meal prep.  REVIEW OF SYSTEMS:  Positive for trouble walking, dizziness, depression, occasional suicidal thoughts, but would not act at this point.  Also, reports occasional nausea, vomiting, poor appetite.  Past medical, social, family history as noted above.  [facility]ications [facility] through [facility] include 1. Phenergan 25 mg one half to one tablet per day p.r.n. for her     nausea. 2. Gabapentin 300 mg 1 at 6:00 a.m., 12:00 p.m., 5:00 p.m., and 2 at  bed time. 3. Percocet 10/325 one and a half tablets at 8:00 a.m. one at noon,     one at 6:00 p.m. and one and a half at 10:00 p.m.  PHYSICAL EXAMINATION:  Blood pressure is 126/61, pulse 95, respirations 18, 91% saturated on room air.  She is a well-developed female who appears her stated age and does not appear to be in any distress.  She is oriented x3.  Speech is clear.  Affect is bright.  She is alert, cooperative and pleasant.  Follows commands without difficulty.  Answers my questions appropriately.  Cranial nerves and coordination are intact. Her reflex is diminished at patellar and the Achilles tendon.  No abnormal tone, clonus or tremors are noted.  Motor strength is overall good in both upper as well as lower extremities.  No tenderness to palpation over the lumbar spine.  She does have a sensory deficit in the left lower extremity of the dorsum of the foot.  Her motor strength is well-preserved in both upper and lower extremities.  Transitions from sitting to standing was slowly.  Her gait is slow and careful.  She is able to ambulate without an assisted by.  She has difficulty with tandem gait.  Romberg test was performed adequately.  She has limited motion, especially at the left elbow pronation and supination.  Wrists are limited as well.  She has some cysts around the wrist.  She has a cyst over the dorsum of the right  foot.  Some increased synovitis, especially right ankle, but also left ankle is involved.  Elbows are somewhat painful as well today.  IMPRESSION: 1. Rheumatoid arthritis with multiple joints involved as noted above.     She is currently followed by Dr. Lanell Matar over at [facility], who is     currently treating her with Enbrel as well as methotrexate. 2. History of multiple vertebroplasty and chronic low back pain. 3. Mild spinal stenosis. 4. Chronic pain syndrome. 5. Limited ambulation capacity secondary to above.  Her medical problems include  history of coronary artery disease, myocardial infarction in 2006, status post 5 stents, hypercholesterolemia, rheumatoid arthritis, tobacco abuse, chronic obstructive pulmonary disease, chronic bronchitis, diabetes mellitus, atrial fibrillation, osteoporosis, history of respiratory infections.  PLAN:  We will refill her pain medications above as noted.  She takes her medications as prescribed.  No evidence of aberrant behavior.  Pill counts are appropriate.  We will continue to monitor and also track urine drug screen.  I have answered all her questions.  She is comfortable with our plan.     Brantley Stage, M.D.    DMK/MedQ D:  12/10/2010 14:33:20  T:  12/11/2010 03:55:13  Job #:  [id number]

## 2011-01-04 ENCOUNTER — Encounter: Payer: Medicaid Other | Attending: Physical Medicine and Rehabilitation

## 2011-01-04 DIAGNOSIS — M4800 Spinal stenosis, site unspecified: Secondary | ICD-10-CM | POA: Insufficient documentation

## 2011-01-04 DIAGNOSIS — M545 Low back pain, unspecified: Secondary | ICD-10-CM | POA: Insufficient documentation

## 2011-01-04 DIAGNOSIS — M19049 Primary osteoarthritis, unspecified hand: Secondary | ICD-10-CM

## 2011-01-04 DIAGNOSIS — M1710 Unilateral primary osteoarthritis, unspecified knee: Secondary | ICD-10-CM

## 2011-01-04 DIAGNOSIS — Z79899 Other long term (current) drug therapy: Secondary | ICD-10-CM | POA: Insufficient documentation

## 2011-01-04 DIAGNOSIS — G894 Chronic pain syndrome: Secondary | ICD-10-CM

## 2011-01-04 DIAGNOSIS — M069 Rheumatoid arthritis, unspecified: Secondary | ICD-10-CM

## 2011-02-01 ENCOUNTER — Encounter: Payer: Medicaid Other | Attending: Neurosurgery | Admitting: Neurosurgery

## 2011-02-01 ENCOUNTER — Ambulatory Visit: Payer: Medicaid Other

## 2011-02-01 DIAGNOSIS — M479 Spondylosis, unspecified: Secondary | ICD-10-CM | POA: Insufficient documentation

## 2011-02-01 DIAGNOSIS — G894 Chronic pain syndrome: Secondary | ICD-10-CM

## 2011-02-01 DIAGNOSIS — M48061 Spinal stenosis, lumbar region without neurogenic claudication: Secondary | ICD-10-CM

## 2011-02-01 DIAGNOSIS — M069 Rheumatoid arthritis, unspecified: Secondary | ICD-10-CM | POA: Insufficient documentation
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
## 2011-03-02 ENCOUNTER — Encounter
Payer: Medicaid Other | Attending: Physical Medicine and Rehabilitation | Admitting: Physical Medicine and Rehabilitation

## 2011-03-02 DIAGNOSIS — G8929 Other chronic pain: Secondary | ICD-10-CM | POA: Insufficient documentation

## 2011-03-02 DIAGNOSIS — M545 Low back pain, unspecified: Secondary | ICD-10-CM

## 2011-03-02 DIAGNOSIS — Z79899 Other long term (current) drug therapy: Secondary | ICD-10-CM | POA: Insufficient documentation

## 2011-03-02 DIAGNOSIS — I2510 Atherosclerotic heart disease of native coronary artery without angina pectoris: Secondary | ICD-10-CM | POA: Insufficient documentation

## 2011-03-02 DIAGNOSIS — J4489 Other specified chronic obstructive pulmonary disease: Secondary | ICD-10-CM | POA: Insufficient documentation

## 2011-03-02 DIAGNOSIS — J449 Chronic obstructive pulmonary disease, unspecified: Secondary | ICD-10-CM | POA: Insufficient documentation

## 2011-03-02 DIAGNOSIS — E7800 Pure hypercholesterolemia, unspecified: Secondary | ICD-10-CM | POA: Insufficient documentation

## 2011-03-02 DIAGNOSIS — G894 Chronic pain syndrome: Secondary | ICD-10-CM

## 2011-03-02 DIAGNOSIS — M25579 Pain in unspecified ankle and joints of unspecified foot: Secondary | ICD-10-CM

## 2011-03-02 DIAGNOSIS — F17200 Nicotine dependence, unspecified, uncomplicated: Secondary | ICD-10-CM | POA: Insufficient documentation

## 2011-03-02 DIAGNOSIS — E119 Type 2 diabetes mellitus without complications: Secondary | ICD-10-CM | POA: Insufficient documentation

## 2011-03-02 DIAGNOSIS — G609 Hereditary and idiopathic neuropathy, unspecified: Secondary | ICD-10-CM | POA: Insufficient documentation

## 2011-03-02 DIAGNOSIS — I252 Old myocardial infarction: Secondary | ICD-10-CM | POA: Insufficient documentation

## 2011-03-02 DIAGNOSIS — M4800 Spinal stenosis, site unspecified: Secondary | ICD-10-CM | POA: Insufficient documentation

## 2011-03-02 DIAGNOSIS — M069 Rheumatoid arthritis, unspecified: Secondary | ICD-10-CM | POA: Insufficient documentation

## 2011-03-02 DIAGNOSIS — R209 Unspecified disturbances of skin sensation: Secondary | ICD-10-CM | POA: Insufficient documentation

## 2011-03-02 NOTE — Assessment & Plan Note (Signed)
[name] is a very pleasant 60 year old woman who is [facility] at [facility] for chronic pain complaints which are related to her rheumatoid arthritis.  Multiple joints are involved including wrists, elbows, and ankles as well as knees at times.  She also has a history of diabetes and has peripheral neuropathy which manifests as burning in her feet.  She is also seen by Dr. Lanell Matar over at [facility] who manages her rheumatoid arthritis and she is on Enbrel and methotrexate.  She is back in today for recheck and refill of medications.  She states that she is having increased burning of her feet especially in the morning.  She had done well when she was taking Lyrica; however, I do not think that the gabapentin is giving her quite the coverage that she had been getting with the Lyrica.  She has been off of Lyrica for many months now.  Her average pain is about 7 or 8 on a scale of 10.  Sleep is fair.  Pain is worse in the morning and toward the end of the day, it is worse with activities, improves with medication.  She reports fair relief with current meds.  Medications prescribed through [facility] include Percocet 10/325 one and a half at 8:00 a.m. and 11:00 p.m., one at 1:00 p.m., and one at 6:00 p.m.  She has also been using gabapentin several times a day 300 mg 6:00 a.m., 12:00 p.m., 5:00 p.m., 3:00 a.m. and two at bedtime.  She uses Phenergan p.r.n. for nausea.  Functional Status:  She can walk about 5 minutes at a time.  She is limited by her joint pain.  She has difficulty with stairs.  She does drive.  She is independent with self-care.  She admits to some dizziness and depression but denies suicidal ideation.  She tells me she had a friend of 20 years who recently passed away about 2 weeks ago and this has depressed her quite a bit.  She also reports occasional nausea and poor appetite.  Past medical, social, family  history is otherwise unchanged.  She maintains contact with primary care as well as rheumatology specialist, Dr. Lanell Matar over at [facility].  She does continue smoke and is cautioned against this.  No other changes in social or family history.  PHYSICAL EXAMINATION:  VITAL SIGNS:  Exam today blood pressure is 114/71, pulse 77, respirations 16, 94% saturation on room air. GENERAL:  She is well-developed, well-nourished woman who does not appear in any distress.  Her affect is somewhat more depressed today, but she answers questions appropriately.  Follows commands without difficulty.  She transitioned from sitting to standing slowly.  She has slightly antalgic gait.  She has some difficulty with tandem gait but is able to perform Romberg adequately.  She has limitations in cervical range of motion as well as lumbar motion.  She has relatively good strength in both upper and lower extremities.  No abnormal tone, clonus, or tremors are noted.  Reflexes are generally diminished in both upper and lower extremities.  She has rather sensitive feet to the touch today.  She has rather significant pannus notable at both ankles.  No effusion is appreciated at the knees today.  IMPRESSION: 1. Rheumatoid arthritis with multiple joints involved followed by Dr.     Lanell Matar, rheumatologist, over at [facility] currently using Enbrel     as well as methotrexate per his direction. 2. History of multiple  vertebroplasty and chronic low back pain. 3. Mild spinal stenosis. 4. Chronic pain syndrome. 5. Peripheral neuropathy with burning feet.  Her medical problems include history of coronary artery disease, myocardial infarction in 2006, status post 5 stents, history of hypercholesterolemia, rheumatoid arthritis, tobacco abuse, chronic obstructive pulmonary disease, chronic bronchitis, diabetes, atrial fibrillation, osteoporosis, and history of respiratory infections.  PLAN:  We will discontinue  gabapentin.  We will restart Lyrica at 25 mg twice a day and after 2 weeks moving to 3 times a day.  She had been on 75 mg twice a day in the past.  The medications were switched for insurance reasons, did not provide coverage for Lyrica.  She has not done as well on gabapentin as she did on Lyrica.  She is waking up with more pain and burning feet.  We will refill her Percocet as well.  She uses 1-1/2 tablets in the morning, one in the afternoon, one in the early evening, and one and a half at bedtime.  I have answered all her questions.  She is comfortable with our plan.  We will continue to monitor her pill counts.  They are okay today.  She has urine drug screen which was done on September 28, 2010, which was consistent.  She takes her medications as prescribed.  No evidence of aberrant behavior appreciated.  I will see her back in a month anticipate possibly increasing her Lyrica slightly next month.     Brantley Stage, M.D. Electronically Signed    DMK/MedQ D:  03/02/2011 14:04:05  T:  03/02/2011 21:40:27  Job #:  [id number]

## 2011-03-30 ENCOUNTER — Encounter
Payer: Medicaid Other | Attending: Physical Medicine and Rehabilitation | Admitting: Physical Medicine and Rehabilitation

## 2011-03-30 DIAGNOSIS — G894 Chronic pain syndrome: Secondary | ICD-10-CM | POA: Insufficient documentation

## 2011-03-30 DIAGNOSIS — G5790 Unspecified mononeuropathy of unspecified lower limb: Secondary | ICD-10-CM | POA: Insufficient documentation

## 2011-03-30 DIAGNOSIS — I2510 Atherosclerotic heart disease of native coronary artery without angina pectoris: Secondary | ICD-10-CM | POA: Insufficient documentation

## 2011-03-30 DIAGNOSIS — M545 Low back pain, unspecified: Secondary | ICD-10-CM

## 2011-03-30 DIAGNOSIS — M4800 Spinal stenosis, site unspecified: Secondary | ICD-10-CM | POA: Insufficient documentation

## 2011-03-30 DIAGNOSIS — J449 Chronic obstructive pulmonary disease, unspecified: Secondary | ICD-10-CM | POA: Insufficient documentation

## 2011-03-30 DIAGNOSIS — J4489 Other specified chronic obstructive pulmonary disease: Secondary | ICD-10-CM | POA: Insufficient documentation

## 2011-03-30 DIAGNOSIS — E119 Type 2 diabetes mellitus without complications: Secondary | ICD-10-CM | POA: Insufficient documentation

## 2011-03-30 DIAGNOSIS — I252 Old myocardial infarction: Secondary | ICD-10-CM | POA: Insufficient documentation

## 2011-03-30 DIAGNOSIS — I4891 Unspecified atrial fibrillation: Secondary | ICD-10-CM | POA: Insufficient documentation

## 2011-03-30 DIAGNOSIS — E7800 Pure hypercholesterolemia, unspecified: Secondary | ICD-10-CM | POA: Insufficient documentation

## 2011-03-30 DIAGNOSIS — M25539 Pain in unspecified wrist: Secondary | ICD-10-CM

## 2011-03-30 DIAGNOSIS — G8929 Other chronic pain: Secondary | ICD-10-CM | POA: Insufficient documentation

## 2011-03-30 DIAGNOSIS — F17200 Nicotine dependence, unspecified, uncomplicated: Secondary | ICD-10-CM | POA: Insufficient documentation

## 2011-03-30 DIAGNOSIS — M810 Age-related osteoporosis without current pathological fracture: Secondary | ICD-10-CM | POA: Insufficient documentation

## 2011-03-30 DIAGNOSIS — M069 Rheumatoid arthritis, unspecified: Secondary | ICD-10-CM | POA: Insufficient documentation

## 2011-03-31 NOTE — Assessment & Plan Note (Signed)
Beth [name] is a very pleasant 60 year old woman who is [facility] at [facility] for chronic pain complaints related to her rheumatoid arthritis manifesting in multiple joint involvement including wrists, elbows, ankles, knees, and low back. She has a history of diabetes and has peripheral neuropathy which manifests as burning in her feet as well.  She is back in today requesting a refill of her pain medications.  Over the last month, she was transitioned from gabapentin which was not working very well for her to Lyrica.  She is currently taking Lyrica 25 mg twice a day.  She reports her pain scores went from a 10 to a 7-1/2 with improvement of her foot pain.  She also uses Percocet up to 5 pills per day on the average.  Her average pain is about 7.  Sleep is fair.  Pain is worse with activities and improves with rest and medication.  She gets some relief with current meds.  FUNCTIONAL STATUS:  She does use a cane ambulation.  She can walk about 5 minutes.  She does not climb stairs.  She is able to drive.  She is independent with self-care.  REVIEW OF SYSTEMS:  Otherwise noncontributory.  She checked suicidal thoughts today.  She would not act on it.  She has been bad recently because she lost a friend and her cat died in the last couple of weeks as well.  She had her cat for about 16 years.  At this point, she would not act on any thoughts that she is having. Review of systems is negative other than some occasional nausea.  Past medical, social, and family history otherwise unchanged.  She maintains contact with Dr. Lanell Matar, her rheumatologist and her primary care physician.  No other changes in past medical, social, and family history.  PHYSICAL EXAMINATION:  Her blood pressure is 142/83, pulse 73, respirations 18, and 100% saturated on room air.  She is a well- developed, elderly woman who does not appear in any distress.  She is oriented  x3.  Speech is clear.  Affect is bright.  She is alert, cooperative, and pleasant.  Follows commands without difficulty. Answers my questions appropriately.  Cranial nerves and coordination are intact.  Her reflexes are generally diminished in the upper and lower extremities.  No abnormal tone, clonus, or tremors are noted.  Motor strength is generally in the 4+/5 range without obvious focal deficit. She transitions slowly from sitting to standing.  Her gait reveals short stride length.  Balance is mildly compromised.  With respect to tandem gait, Romberg test is performed adequately.  She has limitations in lumbar motion.  She has significant pannus noted at the wrists, especially at the ankles and she has limited joint motion especially in the left elbow.  She lacks at least 30 degrees of extension.  She can flex to about a little past 90 degrees.  Pronation and supination are also limited.  Lumbar motion is limited as well.  She has no spinous process tenderness with palpation today.  IMPRESSION: 1. Rheumatoid arthritis with multiple joint involvement, followed by     Dr. Lanell Matar, rheumatologist at [facility].  She is     on Enbrel and methotrexate per his direction. 2. History of multiple vertebroplasty and chronic low back pain. 3. Mild spinal stenosis. 4. Chronic pain syndrome. 5. Peripheral neuropathy with burning feet.  Her medical problems are numerous and include history of coronary artery disease, myocardial infarction  in 2006 status post 5 stents, history of hypercholesterolemia, rheumatoid arthritis, tobacco abuse, chronic obstructive pulmonary disease, chronic bronchitis, diabetes, atrial fibrillation, osteoporosis, and history of respiratory infections.  PLAN:  She continues to use Percocet, she is short of approximately 10 pills today.  I have discussed this with her.  She understands she needs to not take any more than as directed.  She states she  understands and will comply and we will be monitoring this at the next visit.  She has done well switching back from Neurontin to Lyrica.  We will increase her dose today from 75 mg a day in divided doses to 100 mg a day total.  She will be taking 25 mg of Lyrica 2 tablets twice a day.  If she has any problems with it, she will go back to taking 25 mg tablets 3 times today because she has done relatively well on that.  Her last urine drug screen was on October 01, 2010, and was consistent.  I will see her next month for refill of her pain medications.  I have answered all her questions.  She is comfortable with our plan.     Brantley Stage, M.D. Electronically Signed    DMK/MedQ D:  03/30/2011 12:51:53  T:  03/30/2011 13:23:28  Job #:  [id number]

## 2011-04-27 ENCOUNTER — Encounter: Payer: Medicaid Other | Admitting: Physical Medicine and Rehabilitation

## 2011-04-28 LAB — CREATININE, SERUM: GFR calc non Af Amer: >60

## 2011-04-28 LAB — BUN: BUN: 4 — ABNORMAL LOW

## 2011-05-02 ENCOUNTER — Encounter
Payer: Medicaid Other | Attending: Physical Medicine and Rehabilitation | Admitting: Physical Medicine and Rehabilitation

## 2011-05-02 DIAGNOSIS — M545 Low back pain, unspecified: Secondary | ICD-10-CM | POA: Insufficient documentation

## 2011-05-02 DIAGNOSIS — Z79899 Other long term (current) drug therapy: Secondary | ICD-10-CM | POA: Insufficient documentation

## 2011-05-02 DIAGNOSIS — M4800 Spinal stenosis, site unspecified: Secondary | ICD-10-CM | POA: Insufficient documentation

## 2011-05-02 DIAGNOSIS — G609 Hereditary and idiopathic neuropathy, unspecified: Secondary | ICD-10-CM | POA: Insufficient documentation

## 2011-05-02 DIAGNOSIS — G8929 Other chronic pain: Secondary | ICD-10-CM | POA: Insufficient documentation

## 2011-05-02 DIAGNOSIS — M069 Rheumatoid arthritis, unspecified: Secondary | ICD-10-CM

## 2011-05-02 DIAGNOSIS — G894 Chronic pain syndrome: Secondary | ICD-10-CM

## 2011-05-02 DIAGNOSIS — M949 Disorder of cartilage, unspecified: Secondary | ICD-10-CM | POA: Insufficient documentation

## 2011-05-02 DIAGNOSIS — M899 Disorder of bone, unspecified: Secondary | ICD-10-CM | POA: Insufficient documentation

## 2011-05-03 NOTE — Assessment & Plan Note (Signed)
[name] is a pleasant 60 year old woman who is followed here at our [facility] for chronic pain complaints which are related predominantly to her rheumatoid arthritis. She is followed in [place] by Dr. Lanell Matar, has been on Enbrel and methotrexate.  However, she tells me that [facility] pharmacy where she gets her methotrexate has been out and she has been unable to get her methotrexate over the last couple of months due to shortages.  She states being medicated is difficult to switch pharmacies.  Her average pain is between 8 and 9 on a scale of 10.  She gets fair relief with current meds.  Medications prescribed from [facility] include: 1. Percocet 10/325, 150 pills per month. 2. Gabapentin 300 mg #180 per month. 3. P.r.n. Phenergan. 4. Lyrica 25 mg 2 p.o. b.i.d.  Her average pain is 8 on a scale of 10.  She gets fair relief with medications.  FUNCTIONAL STATUS:  She can walk 5 minutes at a time.  She has difficulty with stairs.  She does drive.  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  She works 3rd shift 3 days a week.  Bowel and bladder no new problems.  She reports difficulty walking, some depression and occasional suicidal ideation, but would not carry out these thoughts.  Nausea and vomiting are noted occasionally as well. Sleep apnea as noted.  She follows up with primary care for these.  I have asked her to make sure she maintains contact with Dr. Lorelee Market her primary care physician.  PAST MEDICAL, SOCIAL AND FAMILY HISTORY:  Otherwise unchanged.  PHYSICAL EXAMINATION:  VITAL SIGNS:  Her blood pressure is 137/84, pulse 77, respirations 14 and 95% saturated on room air. GENERAL:  She is a well-developed, well-nourished, elderly woman who appears her stated age and does not appear in any distress.  She is oriented x3.  Speech is clear.  Affect is bright.  She is alert, cooperative, and pleasant.   Follows commands without difficulty. Answers my questions appropriately.  Cranial nerves and coordination are grossly intact.  Reflexes are diminished in the lower extremities.  No abnormal tone, clonus or tremors are noted.  She has some decreased sensation below the knees which is not new.  She has good strength in the lower extremities.  No focal deficits are noted in the upper extremities.  Her hands are evaluated.  She has some joint changes, but does not have any active inflammation at the MCP joints today.  She has some tenderness at the left elbow.  She has elbow flexion contracture as well which is not new.  Her ankles have some pannus.  However, do not appear to be actively inflamed at this time.  She does have a large dorsal cyst on her right foot as well.  This is not new either.  She has limitations in lumbar motion in all planes.  She has a slow careful gait.  IMPRESSION: 1. Rheumatoid arthritis with multiple joint involvement followed by     Dr. Lanell Matar over at [facility].  She is on Enbrel as well as     methotrexate per his direction. 2. History of multiple vertebroplasty, osteopenia, chronic low back     pain. 3. Mild spinal stenosis. 4. Peripheral neuropathy with burning feet.  Her medical problems are numerous and are delineated in the March 30, 2011, note.  Today, I refilled her Percocet 10/325 one and half tablets p.o. q.8 a.m. and a  11 p.m., 1 p.o. q.p.m. and 6 p.m. She takes her medications as prescribed.  No evidence of aberrant behavior is noted.  Her last urine drug screen was consistent.  There was some concern about negative Lyrica present.  However, she was unable to get her Lyrica at the time that the report was done.  She had still been taking Neurontin.  I will see her back in a month.  I have answered all her questions.  She is comfortable with our plan.     Brantley Stage, M.D. Electronically Signed    DMK/MedQ D:  05/02/2011  15:05:43  T:  05/03/2011 05:25:15  Job #:  [id number]

## 2011-05-26 ENCOUNTER — Encounter: Payer: Medicaid Other | Attending: Neurosurgery | Admitting: Neurosurgery

## 2011-05-26 DIAGNOSIS — M4800 Spinal stenosis, site unspecified: Secondary | ICD-10-CM | POA: Insufficient documentation

## 2011-05-26 DIAGNOSIS — G609 Hereditary and idiopathic neuropathy, unspecified: Secondary | ICD-10-CM | POA: Insufficient documentation

## 2011-05-26 DIAGNOSIS — M899 Disorder of bone, unspecified: Secondary | ICD-10-CM | POA: Insufficient documentation

## 2011-05-26 DIAGNOSIS — M069 Rheumatoid arthritis, unspecified: Secondary | ICD-10-CM | POA: Insufficient documentation

## 2011-05-26 DIAGNOSIS — G8929 Other chronic pain: Secondary | ICD-10-CM | POA: Insufficient documentation

## 2011-05-26 DIAGNOSIS — M949 Disorder of cartilage, unspecified: Secondary | ICD-10-CM | POA: Insufficient documentation

## 2011-05-27 ENCOUNTER — Encounter (HOSPITAL_BASED_OUTPATIENT_CLINIC_OR_DEPARTMENT_OTHER): Payer: Medicaid Other | Admitting: Neurosurgery

## 2011-05-27 DIAGNOSIS — G609 Hereditary and idiopathic neuropathy, unspecified: Secondary | ICD-10-CM

## 2011-05-27 DIAGNOSIS — G894 Chronic pain syndrome: Secondary | ICD-10-CM

## 2011-05-27 DIAGNOSIS — M48061 Spinal stenosis, lumbar region without neurogenic claudication: Secondary | ICD-10-CM

## 2011-05-27 DIAGNOSIS — M5430 Sciatica, unspecified side: Secondary | ICD-10-CM
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
## 2011-06-08 ENCOUNTER — Encounter: Payer: Self-pay | Admitting: Family Medicine

## 2011-06-08 ENCOUNTER — Ambulatory Visit (INDEPENDENT_AMBULATORY_CARE_PROVIDER_SITE_OTHER): Payer: Medicaid Other | Admitting: Family Medicine

## 2011-06-08 VITALS — BP 138/84 | HR 94 | Temp 98.1°F | Ht 59.0 in | Wt 140.1 lb

## 2011-06-08 DIAGNOSIS — Z23 Encounter for immunization: Secondary | ICD-10-CM

## 2011-06-08 DIAGNOSIS — R300 Dysuria: Secondary | ICD-10-CM

## 2011-06-08 LAB — POCT URINALYSIS DIPSTICK
Bilirubin, UA: NEGATIVE
Ketones, UA: NEGATIVE
Nitrite, UA: NEGATIVE
pH, UA: 6

## 2011-06-08 LAB — POCT UA - MICROSCOPIC ONLY: RBC, urine, microscopic: >20

## 2011-06-08 MED ORDER — CIPROFLOXACIN HCL 500 MG PO TABS: 500.0000 mg | ORAL_TABLET | Freq: Two times a day (BID) | ORAL | Status: AC

## 2011-06-08 NOTE — Progress Notes (Signed)
  Subjective:    Patient ID: Beth [name], female    DOB: [birth year], 60 y.o.   MRN: [id number]  HPI  DYSURIA  Onset: 2-3 days ago   Worsening: noticing blood and frequency when urinating    Symptoms Urgency: yes  Frequency: yes  Hesitancy: no  Hematuria: yes  Flank Pain: no  Fever: no    Nausea/Vomiting: no  Pregnant: no Discharge: no Irritants: no Rash: no  Red Flags  : (Risk Factors for Complicated UTI) Recent Antibiotic Usage (last 30 days): no  Symptoms lasting more than seven (7) days: no  More than 3 UTI's last 12 months: no  PMH of  1. DM: yes 2. Renal Disease/Calculi: no 3. Urinary Tract Abnormality: no  4. Instrumentation/Trauma: no 5. Immunosuppression: yes  MTX and Enbrel   PMH - has RA and diabetes - see medications above  Review of Systems      Objective:   Physical Exam  Alert no acute distress No CVA tenderness Mild feeling of urgency with lower abdomen palpation no guarding or rebound No rash   Lungs:  Normal respiratory effort, chest expands symmetrically. Lungs are clear to auscultation, no crackles or wheezes. Heart - Regular rate and rhythm.  No murmurs, gallops or rubs.    Neck:  No deformities, thyromegaly, masses, or tenderness noted.   Supple with full range of motion without pain.  Assessment & Plan:

## 2011-06-08 NOTE — Assessment & Plan Note (Signed)
Acute UTI given symptoms and abnormal urine.  Will culture given her complex medical history.   No signs of pyelo but watch closely given her immunosuppresion

## 2011-06-08 NOTE — Patient Instructions (Signed)
Take the cipro antibiotics twice daily until all gone  You should be better in 2-3 days.   If you get high fever, severe abdomen pain or back pain or nausea or vomiting call right away  We will call you if you are not on the right antibiotics   See Dr Vladimir Faster in the next few weeks for a diabetes check

## 2011-06-08 NOTE — Progress Notes (Signed)
Addended by: [place], Sanel Stemmer on: 06/08/2011 02:27 PM   Modules accepted: Orders

## 2011-06-23 ENCOUNTER — Encounter: Payer: Medicaid Other | Attending: Neurosurgery | Admitting: Neurosurgery

## 2011-06-24 ENCOUNTER — Encounter
Payer: Medicaid Other | Attending: Physical Medicine and Rehabilitation | Admitting: Physical Medicine and Rehabilitation

## 2011-06-24 ENCOUNTER — Ambulatory Visit: Payer: Medicaid Other | Admitting: Neurosurgery

## 2011-06-24 DIAGNOSIS — M949 Disorder of cartilage, unspecified: Secondary | ICD-10-CM | POA: Insufficient documentation

## 2011-06-24 DIAGNOSIS — M545 Low back pain, unspecified: Secondary | ICD-10-CM

## 2011-06-24 DIAGNOSIS — M899 Disorder of bone, unspecified: Secondary | ICD-10-CM | POA: Insufficient documentation

## 2011-06-24 DIAGNOSIS — M069 Rheumatoid arthritis, unspecified: Secondary | ICD-10-CM

## 2011-06-24 DIAGNOSIS — M4800 Spinal stenosis, site unspecified: Secondary | ICD-10-CM | POA: Insufficient documentation

## 2011-06-24 DIAGNOSIS — G8929 Other chronic pain: Secondary | ICD-10-CM | POA: Insufficient documentation

## 2011-06-24 DIAGNOSIS — G609 Hereditary and idiopathic neuropathy, unspecified: Secondary | ICD-10-CM | POA: Insufficient documentation

## 2011-06-25 NOTE — Assessment & Plan Note (Signed)
[name] is a pleasant 60 year old woman who is [facility] for chronic pain complaints, which are related to her rheumatoid arthritis.  She has multiple joints involved and deformity including wrists, elbows, ankles, knees, and low back.  She also has a history of diabetes and peripheral neuropathy and has burning feet as well.  She has been using Percocet as well as Lyrica.  She reports fairly good relief with the use of these medications.  Her average pain is about 7 or 8 on a scale of 10, described as aching. She is also using Enbrel as well as methotrexate per Dr. Lanell Matar at [facility].  Sleep is fair.  Pain is worse with activities, improves with medication.  FUNCTIONAL STATUS:  She is independent with self-care.  She can walk short distances.  She does not use an assistive device.  REVIEW OF SYSTEMS:  Negative with the exception of recent bladder infection.  She reports she is going to be seeing a new primary care doctor Dr. Tye Savoy.  No other changes in social or family history since last visit.  She saw the nurse practitioner last month for refill of her medications.  PHYSICAL EXAMINATION:  Her blood pressure is 126/76, pulse 88, respirations 16, 98% saturated on room air.  She is a well-developed, well-nourished woman who appears somewhat older than her stated age. She is oriented x3.  Speech is clear.  Affect is bright.  She is alert, cooperative, and pleasant.  Follows commands without difficulty. Answers my questions appropriately.  Cranial nerves, coordination are intact.  Reflexes are diminished in both upper and lower extremities. No abnormal tone, clonus, or tremors are noted.  Motor strength is generally good.  She has a joint deformity at the elbows, wrists, and ankles.  Her strength is generally in the 5/5 range without focal deficit.  She transitions from sitting to standing  slowly.  Her gait is stable and careful.  She has some difficulty with tandem gait.  Romberg test is performed adequately.  She has limitations in lumbar motion in all planes.  IMPRESSION: 1. Rheumatoid arthritis with multiple joints involved, and she is     followed by Dr. Lanell Matar over at [facility] on Enbrel as well as     methotrexate.  History of multiple vertebroplasty, osteopenia,     chronic low back pain. 2. Mild spinal stenosis. 3. Peripheral neuropathy with burning feet.  PLAN:  We will continue Percocet 10/325 one p.o. at 1:00 p.m. and 6:00 p.m. 1-1/2 at 8:00 a.m. and 11:00 p.m.  Her pill counts were off today.  She should have had 30 pills, she brought in 20.  I discussed this with her.  She believes that she needs to keep better track of what she takes during the day.  She is going to either use a medical journal and write down when she takes a pill or consider a tray that she can fill for the correct number of pills each day and take no more than she puts in.  She is going to attempt to find some way to make sure she takes her medications as prescribed.  She understands that she may be discharged if she is short of medication.  I have answered all her questions.  We will see her back in 3 months. Nurse practitioner to follow up in the next 2 months.     Brantley Stage, M.D. Electronically Signed  DMK/MedQ D:  06/24/2011 14:08:45  T:  06/25/2011 00:59:53  Job #:  [id number]

## 2011-07-04 ENCOUNTER — Ambulatory Visit: Payer: Medicaid Other | Admitting: Family Medicine

## 2011-07-22 ENCOUNTER — Encounter: Payer: Medicaid Other | Attending: Neurosurgery | Admitting: Neurosurgery

## 2011-07-22 DIAGNOSIS — G609 Hereditary and idiopathic neuropathy, unspecified: Secondary | ICD-10-CM

## 2011-07-22 DIAGNOSIS — M48061 Spinal stenosis, lumbar region without neurogenic claudication: Secondary | ICD-10-CM

## 2011-07-22 DIAGNOSIS — M069 Rheumatoid arthritis, unspecified: Secondary | ICD-10-CM | POA: Insufficient documentation

## 2011-07-22 DIAGNOSIS — M4800 Spinal stenosis, site unspecified: Secondary | ICD-10-CM | POA: Insufficient documentation

## 2011-07-23 NOTE — Assessment & Plan Note (Signed)
This is a patient of Dr. Pamelia Hoit seen for rheumatoid arthritis as well as multiple pain complaints.  She reports no change in her pain as 7 or 8.  It is an aching type pain.  General activity level is an 8. The pain is worse in the morning and at night.  Sleep patterns are not indicated.  Pain is worse with walking, sitting and standing. Medications do help.  She uses a cane for ambulation.  She does not climb steps.  She can drive.  She can walk about 5 minutes at a time. Functionally, she is independent.  REVIEW OF SYSTEMS:  Notable for the difficulties described above, otherwise unremarkable.  No suicidal thoughts or aberrant behaviors. Pill counts were correct today.  Last UDS was consistent.  PAST MEDICAL HISTORY, SOCIAL HISTORY AND FAMILY HISTORY:  Unchanged.  PHYSICAL EXAMINATION:  VITAL SIGNS:  Blood pressure is 132/76, pulse 95, respirations 16 and O2 sats 96 on room air. EXTREMITIES:  Motor strength and sensation are grossly intact in the upper and lower extremities.  NEUROLOGIC:  Constitutionally, she is within normal limits.  She is alert and oriented x3.  She has a limp and is slow to rise from a seated position.  IMPRESSION: 1. History of rheumatoid arthritis 2. Spinal stenosis. 3. Peripheral neuropathy.  PLAN:  Refill Percocet 10/325 one p.o. q.i.d. p.r.n. 120 with no refill. She knows to keep her pill counts correct and accurate.  We will see her back in a month.     Nadia Viar L. Blima Dessert Electronically Signed    RLW/MedQ D:  07/22/2011 14:24:42  T:  07/23/2011 05:46:47  Job #:  [id number]

## 2011-08-17 DIAGNOSIS — J069 Acute upper respiratory infection, unspecified: Secondary | ICD-10-CM

## 2011-08-19 ENCOUNTER — Encounter: Payer: Medicaid Other | Attending: Neurosurgery | Admitting: Neurosurgery

## 2011-08-19 DIAGNOSIS — G609 Hereditary and idiopathic neuropathy, unspecified: Secondary | ICD-10-CM

## 2011-08-19 DIAGNOSIS — M48061 Spinal stenosis, lumbar region without neurogenic claudication: Secondary | ICD-10-CM

## 2011-08-19 DIAGNOSIS — M4800 Spinal stenosis, site unspecified: Secondary | ICD-10-CM | POA: Insufficient documentation

## 2011-08-19 DIAGNOSIS — M069 Rheumatoid arthritis, unspecified: Secondary | ICD-10-CM | POA: Insufficient documentation

## 2011-08-20 NOTE — Assessment & Plan Note (Signed)
This is patient of Dr. Pamelia Hoit seen for back and right upper extremity pain.  She reports pain is 7 or 8.  General activity level is 9.  Pain is worse in the morning and evening.  Pain is worse with walking.  Medication helps.  She uses a cane for ambulation.  She does drive.  She does not climb steps.  Functionally, she is independent.  REVIEW OF SYSTEMS:  Notable for difficulties as described above as well as some dizziness, depression, and suicidal thoughts.  No aberrant behaviors.  She is encouraged to see her primary care ED if the suicidal thoughts worsened.  Past medical history, social history, and family history are unchanged.  PHYSICAL EXAMINATION:  VITAL SIGNS:  Her blood pressure is 119/72, pulse 87, respirations 16, O2 sats 96 on room air. MUSCULOSKELETAL:  Motor strength sensation intact. NEUROLOGIC:  Constitutionally she is within normal limits.  She is alert and oriented x3.  She has a limp.  IMPRESSION: 1. History of rheumatoid arthritis. 2. Spinal stenosis. 3. Peripheral polyneuropathy.  PLAN:  Refill Percocet 10/325, one at 1300 and 1800, 1-1/2 at 8 and 2300, 150 with no refills.  Questions were encouraged and answered. Follow up here in a month.     Zaron Zwiefelhofer L. Blima Dessert Electronically Signed    RLW/MedQ D:  08/19/2011 12:57:42  T:  08/20/2011 08:65:78  Job #:  [id number]

## 2011-09-14 ENCOUNTER — Other Ambulatory Visit: Payer: Self-pay | Admitting: *Deleted

## 2011-09-14 MED ORDER — PREGABALIN 25 MG PO CAPS: 50.0000 mg | ORAL_CAPSULE | Freq: Two times a day (BID) | ORAL | Status: DC

## 2011-09-16 ENCOUNTER — Ambulatory Visit: Payer: Medicaid Other | Admitting: Physical Medicine and Rehabilitation

## 2011-09-19 ENCOUNTER — Encounter: Payer: Self-pay | Admitting: Physical Medicine and Rehabilitation

## 2011-09-19 ENCOUNTER — Encounter: Payer: Medicaid Other | Attending: Neurosurgery | Admitting: Physical Medicine and Rehabilitation

## 2011-09-19 VITALS — BP 149/86 | HR 93 | Resp 18 | Ht 59.0 in | Wt 141.0 lb

## 2011-09-19 DIAGNOSIS — G909 Disorder of the autonomic nervous system, unspecified: Secondary | ICD-10-CM

## 2011-09-19 DIAGNOSIS — Z5181 Encounter for therapeutic drug level monitoring: Secondary | ICD-10-CM | POA: Insufficient documentation

## 2011-09-19 DIAGNOSIS — E1142 Type 2 diabetes mellitus with diabetic polyneuropathy: Secondary | ICD-10-CM

## 2011-09-19 DIAGNOSIS — M549 Dorsalgia, unspecified: Secondary | ICD-10-CM

## 2011-09-19 DIAGNOSIS — E1149 Type 2 diabetes mellitus with other diabetic neurological complication: Secondary | ICD-10-CM

## 2011-09-19 DIAGNOSIS — M722 Plantar fascial fibromatosis: Secondary | ICD-10-CM

## 2011-09-19 DIAGNOSIS — M25579 Pain in unspecified ankle and joints of unspecified foot: Secondary | ICD-10-CM

## 2011-09-19 DIAGNOSIS — G8929 Other chronic pain: Secondary | ICD-10-CM

## 2011-09-19 DIAGNOSIS — M25529 Pain in unspecified elbow: Secondary | ICD-10-CM | POA: Insufficient documentation

## 2011-09-19 DIAGNOSIS — M25539 Pain in unspecified wrist: Secondary | ICD-10-CM

## 2011-09-19 MED ORDER — OXYCODONE-ACETAMINOPHEN 10-325 MG PO TABS: 1.0000 | ORAL_TABLET | ORAL | Status: DC | PRN

## 2011-09-19 NOTE — Progress Notes (Signed)
Subjective:    Patient ID: Beth [name], female    DOB: 08/11/1950, 61 y.o.   MRN: [id number]  HPI The patient is a 61 year old woman who is followed here at the center for pain and rehabilitative medicine. She has a history of rheumatoid arthritis with predominant involvement of her wrist elbows knees ankles and low back. She has significant deformity in the wrist and hands as well as ankles.  She also has a history of diabetes mellitus and peripheral neuropathy and has burning feet as well.  Her average pain is between 7 and 8 on a scale of 10 and is described as aching. Joint pain interfered significantly with activity levels. Sleep is fair pain is worse of activities in general rest and medication he uses it somewhat.  She reports generally good relief with the use of current medications.  She currently uses a cane for ambulation and can walk close to 10 minutes she has difficulty with stairs and is able to drive. She is independent with feeding dressing bathing and toileting. She can do some light meal preparation as well.  She admits to some depression and has a history of a suicide attempt at age 40. She states although she thinks about it occasionally she has no intention of carrying out these thoughts. She states she has been adequate support system with friends and family. She is not interested in referral to behavioral health at this time but will let me know she changes her mind.    Pain Inventory Average Pain 7 Pain Right Now 8 My pain is constant and aching  In the last 24 hours, has pain interfered with the following? General activity 9 Relation with others 9 Enjoyment of life 9 What TIME of day is your pain at its worst? all day Sleep (in general) Fair  Pain is worse with: sitting Pain improves with: medication Relief from Meds: 7  Mobility walk with assistance use a walker how many minutes can you walk? 10 ability to climb steps?  no do you drive?   yes Do you have any goals in this area?  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  no  Neuro/Psych depression suicidal thoughts  Prior Studies bone scan CT/MRI  Physicians involved in your care Primary care Dr Domenick Bookbinder Rheumatologist Dr Lanell Matar Cardiologist, Dr Allyson Sabal      Review of Systems  HENT: Positive for congestion, sneezing and sinus pressure.   Eyes: Negative.   Respiratory: Positive for cough.   Cardiovascular: Negative.   Gastrointestinal: Negative.   Genitourinary: Positive for vaginal discharge (itching, discharge).  Musculoskeletal: Positive for myalgias, back pain, arthralgias and gait problem.  Skin: Negative.   Neurological: Positive for weakness.  Hematological: Negative.   Psychiatric/Behavioral: Positive for suicidal ideas (attempted in her 60's. No acute feelings / thoughts). The patient is nervous/anxious.        Objective:   Physical Exam        Assessment & Plan:  1. Rheumatoid arthritis with multiple joints involved. She states she still being followed by Dr. Lanell Matar over at [facility]. She is taking ENBREL as well as methotrexate per his instruction.  2. Lumbago with a history of osteopenia and multiple vertebralplasty.  3. Mild spinal stenosis  4. Peripheral neuropathy with burning feet.  The Opioid medication pill count was not appropriate, she was 13 pills short.  We discussed the importance of taking medication as prescribed.  No reported significant side effects of Opioid medications noted.  No aberrant behavior noted.  Patient cautioned regarding operation of machinery or vehicles.  Patient understands risk and benefits of these medications.  There is no indication or report of risk to self or others.  Last UDS 03/30/11.  Will refill pain medication. Patient returned to clinic in one month for opioid drug monitoring.  This will include pill counts and random urine drug monitoring.

## 2011-09-19 NOTE — Patient Instructions (Signed)
Plantar Fasciitis (Heel Spur Syndrome) with Rehab The plantar fascia is a fibrous, ligament-like, soft-tissue structure that spans the bottom of the foot. Plantar fasciitis is a condition that causes pain in the foot due to inflammation of the tissue. SYMPTOMS   Pain and tenderness on the underneath side of the foot.   Pain that worsens with standing or walking.  CAUSES  Plantar fasciitis is caused by irritation and injury to the plantar fascia on the underneath side of the foot. Common mechanisms of injury include:  Direct trauma to bottom of the foot.   Damage to a small nerve that runs under the foot where the main fascia attaches to the heel bone.   Stress placed on the plantar fascia due to bone spurs.  RISK INCREASES WITH:   Activities that place stress on the plantar fascia (running, jumping, pivoting, or cutting).   Poor strength and flexibility.   Improperly fitted shoes.   Tight calf muscles.   Flat feet.   Failure to warm-up properly before activity.   Obesity.  PREVENTION  Warm up and stretch properly before activity.   Allow for adequate recovery between workouts.   Maintain physical fitness:   Strength, flexibility, and endurance.   Cardiovascular fitness.   Maintain a health body weight.   Avoid stress on the plantar fascia.   Wear properly fitted shoes, including arch supports for individuals who have flat feet.  PROGNOSIS  If treated properly, then the symptoms of plantar fasciitis usually resolve without surgery. However, occasionally surgery is necessary. RELATED COMPLICATIONS   Recurrent symptoms that may result in a chronic condition.   Problems of the lower back that are caused by compensating for the injury, such as limping.   Pain or weakness of the foot during push-off following surgery.   Chronic inflammation, scarring, and partial or complete fascia tear, occurring more often from repeated injections.  TREATMENT  Treatment  initially involves the use of ice and medication to help reduce pain and inflammation. The use of strengthening and stretching exercises may help reduce pain with activity, especially stretches of the Achilles tendon. These exercises may be performed at home or with a therapist. Your caregiver may recommend that you use heel cups of arch supports to help reduce stress on the plantar fascia. Occasionally, corticosteroid injections are given to reduce inflammation. If symptoms persist for greater than 6 months despite non-surgical (conservative), then surgery may be recommended.  MEDICATION   If pain medication is necessary, then nonsteroidal anti-inflammatory medications, such as aspirin and ibuprofen, or other minor pain relievers, such as acetaminophen, are often recommended.   Do not take pain medication within 7 days before surgery.   Prescription pain relievers may be given if deemed necessary by your caregiver. Use only as directed and only as much as you need.   Corticosteroid injections may be given by your caregiver. These injections should be reserved for the most serious cases, because they may only be given a certain number of times.  HEAT AND COLD  Cold treatment (icing) relieves pain and reduces inflammation. Cold treatment should be applied for 10 to 15 minutes every 2 to 3 hours for inflammation and pain and immediately after any activity that aggravates your symptoms. Use ice packs or massage the area with a piece of ice (ice massage).   Heat treatment may be used prior to performing the stretching and strengthening activities prescribed by your caregiver, physical therapist, or athletic trainer. Use a heat pack or soak the   injury in warm water.  SEEK IMMEDIATE MEDICAL CARE IF:  Treatment seems to offer no benefit, or the condition worsens.   Any medications produce adverse side effects.  EXERCISES RANGE OF MOTION (ROM) AND STRETCHING EXERCISES - Plantar Fasciitis (Heel Spur  Syndrome) These exercises may help you when beginning to rehabilitate your injury. Your symptoms may resolve with or without further involvement from your physician, physical therapist or athletic trainer. While completing these exercises, remember:   Restoring tissue flexibility helps normal motion to return to the joints. This allows healthier, less painful movement and activity.   An effective stretch should be held for at least 30 seconds.   A stretch should never be painful. You should only feel a gentle lengthening or release in the stretched tissue.  RANGE OF MOTION - Toe Extension, Flexion  Sit with your right / left leg crossed over your opposite knee.   Grasp your toes and gently pull them back toward the top of your foot. You should feel a stretch on the bottom of your toes and/or foot.   Hold this stretch for __________ seconds.   Now, gently pull your toes toward the bottom of your foot. You should feel a stretch on the top of your toes and or foot.   Hold this stretch for __________ seconds.  Repeat __________ times. Complete this stretch __________ times per day.  RANGE OF MOTION - Ankle Dorsiflexion, Active Assisted  Remove shoes and sit on a chair that is preferably not on a carpeted surface.   Place right / left foot under knee. Extend your opposite leg for support.   Keeping your heel down, slide your right / left foot back toward the chair until you feel a stretch at your ankle or calf. If you do not feel a stretch, slide your bottom forward to the edge of the chair, while still keeping your heel down.   Hold this stretch for __________ seconds.  Repeat __________ times. Complete this stretch __________ times per day.  STRETCH - Gastroc, Standing  Place hands on wall.   Extend right / left leg, keeping the front knee somewhat bent.   Slightly point your toes inward on your back foot.   Keeping your right / left heel on the floor and your knee straight, shift  your weight toward the wall, not allowing your back to arch.   You should feel a gentle stretch in the right / left calf. Hold this position for __________ seconds.  Repeat __________ times. Complete this stretch __________ times per day. STRETCH - Soleus, Standing  Place hands on wall.   Extend right / left leg, keeping the other knee somewhat bent.   Slightly point your toes inward on your back foot.   Keep your right / left heel on the floor, bend your back knee, and slightly shift your weight over the back leg so that you feel a gentle stretch deep in your back calf.   Hold this position for __________ seconds.  Repeat __________ times. Complete this stretch __________ times per day. STRETCH - Gastrocsoleus, Standing  Note: This exercise can place a lot of stress on your foot and ankle. Please complete this exercise only if specifically instructed by your caregiver.   Place the ball of your right / left foot on a step, keeping your other foot firmly on the same step.   Hold on to the wall or a rail for balance.   Slowly lift your other foot, allowing your   body weight to press your heel down over the edge of the step.   You should feel a stretch in your right / left calf.   Hold this position for __________ seconds.   Repeat this exercise with a slight bend in your right / left knee.  Repeat __________ times. Complete this stretch __________ times per day.  STRENGTHENING EXERCISES - Plantar Fasciitis (Heel Spur Syndrome)  These exercises may help you when beginning to rehabilitate your injury. They may resolve your symptoms with or without further involvement from your physician, physical therapist or athletic trainer. While completing these exercises, remember:   Muscles can gain both the endurance and the strength needed for everyday activities through controlled exercises.   Complete these exercises as instructed by your physician, physical therapist or athletic trainer.  Progress the resistance and repetitions only as guided.  STRENGTH - Towel Curls  Sit in a chair positioned on a non-carpeted surface.   Place your foot on a towel, keeping your heel on the floor.   Pull the towel toward your heel by only curling your toes. Keep your heel on the floor.   If instructed by your physician, physical therapist or athletic trainer, add ____________________ at the end of the towel.  Repeat __________ times. Complete this exercise __________ times per day. STRENGTH - Ankle Inversion  Secure one end of a rubber exercise band/tubing to a fixed object (table, pole). Loop the other end around your foot just before your toes.   Place your fists between your knees. This will focus your strengthening at your ankle.   Slowly, pull your big toe up and in, making sure the band/tubing is positioned to resist the entire motion.   Hold this position for __________ seconds.   Have your muscles resist the band/tubing as it slowly pulls your foot back to the starting position.  Repeat __________ times. Complete this exercises __________ times per day.  Document Released: 07/04/2005 Document Revised: 06/23/2011 Document Reviewed: 10/16/2008 [employer] Patient Information 2012 [employer]. 

## 2011-09-19 NOTE — Progress Notes (Deleted)
  Subjective:    Patient ID: Beth [name], female    DOB: 07/07/1951, 60 y.o.   MRN: [id number]  HPI    Review of Systems     Objective:   Physical Exam        Assessment & Plan:   

## 2011-09-19 NOTE — Progress Notes (Deleted)
  Subjective:    Patient ID: Beth [name], female    DOB: 1951/05/29, 61 y.o.   MRN: [id number]  HPI    Review of Systems     Objective:   Physical Exam        Assessment & Plan:

## 2011-09-20 ENCOUNTER — Telehealth: Payer: Self-pay | Admitting: *Deleted

## 2011-09-20 NOTE — Telephone Encounter (Signed)
Called patient to follow up on her diabetes and have A1C and LDL done. She agreed to come in 10/27/11 at 1:30 with PCP.Kaira Stringfield, Rodena Medin

## 2011-10-20 ENCOUNTER — Encounter: Payer: Medicaid Other | Attending: Physical Medicine and Rehabilitation | Admitting: *Deleted

## 2011-10-20 ENCOUNTER — Encounter: Payer: Self-pay | Admitting: *Deleted

## 2011-10-20 VITALS — BP 135/81 | HR 78 | Resp 18 | Ht 59.0 in | Wt 136.0 lb

## 2011-10-20 DIAGNOSIS — G894 Chronic pain syndrome: Secondary | ICD-10-CM

## 2011-10-20 DIAGNOSIS — G609 Hereditary and idiopathic neuropathy, unspecified: Secondary | ICD-10-CM

## 2011-10-20 DIAGNOSIS — M549 Dorsalgia, unspecified: Secondary | ICD-10-CM

## 2011-10-20 DIAGNOSIS — M069 Rheumatoid arthritis, unspecified: Secondary | ICD-10-CM

## 2011-10-20 MED ORDER — OXYCODONE-ACETAMINOPHEN 10-325 MG PO TABS: 1.0000 | ORAL_TABLET | ORAL | Status: DC | PRN

## 2011-10-20 NOTE — Progress Notes (Signed)
States feels pretty good today, can feel that it is going to rain, though. No questions voiced for MD.

## 2011-10-21 ENCOUNTER — Encounter: Payer: Self-pay | Admitting: Physical Medicine & Rehabilitation

## 2011-10-27 ENCOUNTER — Ambulatory Visit: Payer: Medicaid Other | Admitting: Family Medicine

## 2011-11-14 ENCOUNTER — Other Ambulatory Visit: Payer: Self-pay

## 2011-11-14 MED ORDER — PREGABALIN 25 MG PO CAPS: 25.0000 mg | ORAL_CAPSULE | Freq: Two times a day (BID) | ORAL | Status: DC

## 2011-11-16 ENCOUNTER — Encounter: Payer: Medicaid Other | Attending: Neurosurgery | Admitting: Physical Medicine and Rehabilitation

## 2011-11-16 ENCOUNTER — Encounter: Payer: Self-pay | Admitting: Physical Medicine and Rehabilitation

## 2011-11-16 VITALS — BP 122/73 | HR 92 | Resp 16 | Ht 59.0 in | Wt 136.0 lb

## 2011-11-16 DIAGNOSIS — M25539 Pain in unspecified wrist: Secondary | ICD-10-CM

## 2011-11-16 DIAGNOSIS — M25529 Pain in unspecified elbow: Secondary | ICD-10-CM

## 2011-11-16 DIAGNOSIS — M1990 Unspecified osteoarthritis, unspecified site: Secondary | ICD-10-CM

## 2011-11-16 DIAGNOSIS — E1142 Type 2 diabetes mellitus with diabetic polyneuropathy: Secondary | ICD-10-CM

## 2011-11-16 DIAGNOSIS — Z5181 Encounter for therapeutic drug level monitoring: Secondary | ICD-10-CM | POA: Insufficient documentation

## 2011-11-16 DIAGNOSIS — E1149 Type 2 diabetes mellitus with other diabetic neurological complication: Secondary | ICD-10-CM

## 2011-11-16 DIAGNOSIS — G8929 Other chronic pain: Secondary | ICD-10-CM

## 2011-11-16 DIAGNOSIS — G909 Disorder of the autonomic nervous system, unspecified: Secondary | ICD-10-CM

## 2011-11-16 DIAGNOSIS — M25579 Pain in unspecified ankle and joints of unspecified foot: Secondary | ICD-10-CM

## 2011-11-16 DIAGNOSIS — M549 Dorsalgia, unspecified: Secondary | ICD-10-CM

## 2011-11-16 MED ORDER — OXYCODONE-ACETAMINOPHEN 10-325 MG PO TABS: 1.0000 | ORAL_TABLET | ORAL | Status: DC | PRN

## 2011-11-16 NOTE — Patient Instructions (Signed)
Please make sure you keep your medications locked all and in a secure location.  Please do not drive while taking these medications.  Please maintain contact with your other health care providers.

## 2011-11-16 NOTE — Progress Notes (Signed)
Subjective:    Patient ID: Beth [name], female    DOB: March 05, 1951, 61 y.o.   MRN: [id number]  HPIThe patient is a 61 year old woman who is followed here at the center for pain and rehabilitative medicine. She has a history of rheumatoid arthritis with predominant involvement of her wrist elbows knees ankles and low back. She has significant deformity in the wrist and hands as well as ankles.  She also has a history of diabetes mellitus and peripheral neuropathy and has burning feet as well.  Her average pain is between 7 and 8 on a scale of 10 and is described as aching. Joint pain interfered significantly with activity levels. Sleep is fair pain is worse of activities in general rest and medication he uses it somewhat.  She reports generally good relief with the use of current medications.    She currently uses a cane for ambulation and can walk close to 10 minutes she has difficulty with stairs and is able to drive. She is independent with feeding dressing bathing and toileting. She can do some light meal preparation as well.  She admits to some depression and has a history of a suicide attempt at age 61. She states although she thinks about it occasionally she has no intention of carrying out these thoughts. She states she has been adequate support system with friends and family. She is not interested in referral to behavioral health at this time but will let me know she changes her mind.     Pain Inventory Average Pain 8 Pain Right Now 8 My pain is constant, sharp and aching  In the last 24 hours, has pain interfered with the following? General activity 9 Relation with others 9 Enjoyment of life 9 What TIME of day is your pain at its worst? morning and night Sleep (in general) Poor  Pain is worse with: walking Pain improves with: medication Relief from Meds: 7  Mobility use a cane how many minutes can you walk? 5 min ability to climb steps?  no do you drive?   yes  Function retired  Neuro/Psych suicidal thoughts-pt has had this issue since age 26 she does not have a plan.  Pt has not talked to anyone about this.  She is very anxious.  Prior Studies Any changes since last visit?  no  Physicians involved in your care Any changes since last visit?  no        Review of Systems  Constitutional: Negative.   HENT: Negative.   Eyes: Negative.   Respiratory: Negative.   Cardiovascular: Negative.   Gastrointestinal: Positive for nausea.  Genitourinary: Negative.   Musculoskeletal: Negative.   Skin: Negative.   Neurological: Negative.   Hematological: Negative.   Psychiatric/Behavioral: Positive for suicidal ideas and dysphoric mood.       Objective:   Physical Exam  She is well-developed, well-nourished woman, who does not  appear in any distress. She is oriented x3. Speech is clear. Affect  is bright. She is alert, cooperative, and pleasant. Follows commands  without difficulty. Answers my questions appropriately.    Cranial nerves, coordination are intact.   Reflexes are  diminished at the patellar and Achilles tendons. No abnormal tone,  clonus, or tremors are noted.   Her motor strength is overall good in  both lower extremities without obvious focal deficit.  Reports no sensory deficit to light touch.  R ankle deformity secondary to synovitis no erythema.  R wrist deformity and decreased ROM, no erythema.  She is able to transition from sitting to standing.    Her gait is stable without an assistive device at this time.    She has some difficulty with tandem gait, but Romberg test is performed  adequately.   Mild spinal tenderness is appreciated with palpation throughout thoracic and lumbar spine.       Assessment & Plan:  1. Rheumatoid arthritis with multiple joints involved. She states she still being followed by Dr. Lanell Matar over at [facility]. She is taking ENBREL as well as methotrexate per his  instruction.    2. Lumbago with a history of osteopenia and multiple vertebralplasty.    3. Mild spinal stenosis    4. Peripheral neuropathy with burning feet.   The Opioid medication pill count was appropriate. . No reported significant side effects of Opioid medications noted.   No aberrant behavior noted.   Patient cautioned regarding operation of machinery or vehicles.   Patient understands risk and benefits of these medications.   There is no indication or report of risk to self or others.   Last UDS:   09/23/2011 at 11:34 AM Consistent     Will refill pain medication. Patient returned to clinic in one month for opioid drug monitoring. This will include pill counts and random urine drug monitoring.

## 2011-11-16 NOTE — Progress Notes (Signed)
Addended by: Ashok Cordia on: 11/16/2011 12:10 PM   Modules accepted: Orders

## 2011-12-14 ENCOUNTER — Telehealth: Payer: Self-pay | Admitting: Physical Medicine and Rehabilitation

## 2011-12-14 NOTE — Telephone Encounter (Signed)
I spoke to pharmacist at pt pharmacy and they state that she got #150 tablets. Made a call to Dr. Pamelia Hoit to see what she wants me to do. Pt aware that I am awaiting Dr. Leretha Dykes call.

## 2011-12-14 NOTE — Telephone Encounter (Signed)
After speaking to Dr. Pamelia Hoit, pt will have to wait until her appointment on Friday for a refill. Pt aware.

## 2011-12-14 NOTE — Telephone Encounter (Signed)
Patient needs some pain mediccine.  Stated pharmacy shorted her about 15 pills and she is going thru withdrawals.  She cannot wait until her appointment

## 2011-12-16 ENCOUNTER — Encounter
Payer: Medicaid Other | Attending: Physical Medicine and Rehabilitation | Admitting: Physical Medicine and Rehabilitation

## 2011-12-16 ENCOUNTER — Encounter: Payer: Self-pay | Admitting: Physical Medicine and Rehabilitation

## 2011-12-16 VITALS — BP 118/67 | HR 70 | Resp 14 | Ht 60.0 in | Wt 133.0 lb

## 2011-12-16 DIAGNOSIS — E119 Type 2 diabetes mellitus without complications: Secondary | ICD-10-CM | POA: Insufficient documentation

## 2011-12-16 DIAGNOSIS — E7800 Pure hypercholesterolemia, unspecified: Secondary | ICD-10-CM | POA: Insufficient documentation

## 2011-12-16 DIAGNOSIS — I2510 Atherosclerotic heart disease of native coronary artery without angina pectoris: Secondary | ICD-10-CM | POA: Insufficient documentation

## 2011-12-16 DIAGNOSIS — I252 Old myocardial infarction: Secondary | ICD-10-CM | POA: Insufficient documentation

## 2011-12-16 DIAGNOSIS — J449 Chronic obstructive pulmonary disease, unspecified: Secondary | ICD-10-CM | POA: Insufficient documentation

## 2011-12-16 DIAGNOSIS — F17200 Nicotine dependence, unspecified, uncomplicated: Secondary | ICD-10-CM | POA: Insufficient documentation

## 2011-12-16 DIAGNOSIS — G8929 Other chronic pain: Secondary | ICD-10-CM | POA: Insufficient documentation

## 2011-12-16 DIAGNOSIS — M545 Low back pain, unspecified: Secondary | ICD-10-CM | POA: Insufficient documentation

## 2011-12-16 DIAGNOSIS — M810 Age-related osteoporosis without current pathological fracture: Secondary | ICD-10-CM | POA: Insufficient documentation

## 2011-12-16 DIAGNOSIS — G609 Hereditary and idiopathic neuropathy, unspecified: Secondary | ICD-10-CM | POA: Insufficient documentation

## 2011-12-16 DIAGNOSIS — J4489 Other specified chronic obstructive pulmonary disease: Secondary | ICD-10-CM | POA: Insufficient documentation

## 2011-12-16 DIAGNOSIS — M069 Rheumatoid arthritis, unspecified: Secondary | ICD-10-CM | POA: Insufficient documentation

## 2011-12-16 MED ORDER — OXYCODONE-ACETAMINOPHEN 10-325 MG PO TABS: 1.0000 | ORAL_TABLET | ORAL | Status: DC | PRN

## 2011-12-16 NOTE — Patient Instructions (Signed)
Continue with medication, continue with walking program. Advised patient to cut down on the smoking.

## 2011-12-16 NOTE — Progress Notes (Deleted)
Subjective:    Patient ID: Beth [name], female    DOB: May 05, 1951, 61 y.o.   MRN: [id number]  HPI The patient complains about chronic back  Pain . The patient denies any radiation. The patient also complains about numbness and tingling in her feet. The problem has been stable. The patient reports that she was out off her medication one day early, because she has a lot of stress at home, her ex husband has been admitted to a hospitz, and this stress aggrevated her pain some.  Pain Inventory Average Pain 7 Pain Right Now 9 My pain is aching  In the last 24 hours, has pain interfered with the following? General activity 9 Relation with others 9 Enjoyment of life 9 What TIME of day is your pain at its worst? night Sleep (in general) Fair  Pain is worse with: walking and sitting Pain improves with: medication Relief from Meds: 8  Mobility use a cane ability to climb steps?  no do you drive?  yes  Function retired  Neuro/Psych No problems in this area  Prior Studies Any changes since last visit?  no  Physicians involved in your care Any changes since last visit?  no   Family History  Problem Relation Age of Onset  . COPD Mother   . Heart disease Father   . Diabetes Sister   . COPD Brother   . Diabetes Sister   . Diabetes Brother    History   Social History  . Marital Status: Divorced    Spouse Name: N/A    Number of Children: N/A  . Years of Education: N/A   Social History Main Topics  . Smoking status: Current Everyday Smoker -- 1.0 packs/day for 45 years    Types: Cigarettes  . Smokeless tobacco: Never Used  . Alcohol Use: No  . Drug Use: No  . Sexually Active: None   Other Topics Concern  . None   Social History Narrative  . None   Past Surgical History  Procedure Date  . Appendectomy   . Abdominal hysterectomy   . Stents 2007    5 cardiac stents   Past Medical History  Diagnosis Date  . CAD (coronary artery disease)   . Acute MI  2006  . Hypercholesterolemia   . Rheumatoid arthritis   . Tobacco abuse   . COPD (chronic obstructive pulmonary disease)   . Bronchitis, chronic   . Diabetes mellitus   . Atrial fibrillation   . Osteoporosis   . Multiple URI    BP 118/67  Pulse 70  Resp 14  Ht 5' (1.524 m)  Wt 133 lb (60.328 kg)  BMI 25.97 kg/m2  SpO2 97%      Review of Systems  Constitutional: Negative.   HENT: Negative.   Eyes: Negative.   Respiratory: Negative.   Cardiovascular: Negative.   Gastrointestinal: Negative.   Genitourinary: Negative.   Musculoskeletal: Positive for back pain.  Skin: Negative.   Neurological: Negative.   Hematological: Negative.   Psychiatric/Behavioral: Negative.        Objective:   Physical Exam  Constitutional: She is oriented to person, place, and time. She appears well-developed.  HENT:  Head: Normocephalic.  Eyes: Pupils are equal, round, and reactive to light.  Neck: Neck supple.  Neurological: She is alert and oriented to person, place, and time.  Skin: Skin is warm and dry.  Psychiatric: She has a normal mood and affect.     Symmetric normal motor tone  is noted throughout. Normal muscle bulk. Muscle testing reveals 5/5 muscle strength of the upper extremity, and 5/5 of the lower extremity. Full range of motion in upper and lower extremities. ROM of spine is  Restricted, kyphotic. Deformity of right wrist.  DTR in the upper and lower extremity are present and symmetric 2+. No clonus is noted.  Patient arises from chair with difficulty. Wide based gait with a cane, thoracic kyphosis ++ . Extreme tenderness to even light touch on her entire back.       Assessment & Plan:  This is a 61 year old female  with 1.LBP 2.Peripheral neuropathy 3.RA 4.DM Plan :  Continue with medications, advised patient to cut down further on her smoking, she states, that she smokes 1 pack /day. Advised patient to see a podiatrist to evaluate the mass on the top of her  right foot . Follow up in 1 month.

## 2012-01-05 NOTE — Progress Notes (Signed)
Subjective:    Patient ID: Beth [name], female    DOB: [birth year], 61 y.o.   MRN: [id number]  Back Pain   The patient complains about chronic back  Pain . The patient denies any radiation. The patient also complains about numbness and tingling in her feet. The problem has been stable. The patient reports that she was out off her medication one day early, because she has a lot of stress at home, her ex husband has been admitted to a hospitz, and this stress aggrevated her pain some.  Pain Inventory Average Pain 7 Pain Right Now 9 My pain is aching  In the last 24 hours, has pain interfered with the following? General activity 9 Relation with others 9 Enjoyment of life 9 What TIME of day is your pain at its worst? night Sleep (in general) Fair  Pain is worse with: walking and sitting Pain improves with: medication Relief from Meds: 8  Mobility use a cane ability to climb steps?  no do you drive?  yes  Function retired  Neuro/Psych No problems in this area  Prior Studies Any changes since last visit?  no  Physicians involved in your care Any changes since last visit?  no   Family History  Problem Relation Age of Onset  . COPD Mother   . Heart disease Father   . Diabetes Sister   . COPD Brother   . Diabetes Sister   . Diabetes Brother    History   Social History  . Marital Status: Divorced    Spouse Name: N/A    Number of Children: N/A  . Years of Education: N/A   Social History Main Topics  . Smoking status: Current Everyday Smoker -- 1.0 packs/day for 45 years    Types: Cigarettes  . Smokeless tobacco: Never Used  . Alcohol Use: No  . Drug Use: No  . Sexually Active: None   Other Topics Concern  . None   Social History Narrative  . None   Past Surgical History  Procedure Date  . Appendectomy   . Abdominal hysterectomy   . Stents 2007    5 cardiac stents   Past Medical History  Diagnosis Date  . CAD (coronary artery disease)   .  Acute MI 2006  . Hypercholesterolemia   . Rheumatoid arthritis   . Tobacco abuse   . COPD (chronic obstructive pulmonary disease)   . Bronchitis, chronic   . Diabetes mellitus   . Atrial fibrillation   . Osteoporosis   . Multiple URI    BP 118/67  Pulse 70  Resp 14  Ht 5' (1.524 m)  Wt 133 lb (60.328 kg)  BMI 25.97 kg/m2  SpO2 97%      Review of Systems  Constitutional: Negative.   HENT: Negative.   Eyes: Negative.   Respiratory: Negative.   Cardiovascular: Negative.   Gastrointestinal: Negative.   Genitourinary: Negative.   Musculoskeletal: Positive for back pain.  Skin: Negative.   Neurological: Negative.   Hematological: Negative.   Psychiatric/Behavioral: Negative.        Objective:   Physical Exam  Constitutional: She is oriented to person, place, and time. She appears well-developed.  HENT:  Head: Normocephalic.  Eyes: Pupils are equal, round, and reactive to light.  Neck: Neck supple.  Neurological: She is alert and oriented to person, place, and time.  Skin: Skin is warm and dry.  Psychiatric: She has a normal mood and affect.     Symmetric  normal motor tone is noted throughout. Normal muscle bulk. Muscle testing reveals 5/5 muscle strength of the upper extremity, and 5/5 of the lower extremity. Full range of motion in upper and lower extremities. ROM of spine is  Restricted, kyphotic. Deformity of right wrist.  DTR in the upper and lower extremity are present and symmetric 2+. No clonus is noted.  Patient arises from chair with difficulty. Wide based gait with a cane, thoracic kyphosis ++ . Extreme tenderness to even light touch on her entire back.       Assessment & Plan:  This is a 61 year old female  with 1.LBP 2.Peripheral neuropathy 3.RA 4.DM Plan :  Continue with medications, advised patient to cut down further on her smoking, she states, that she smokes 1 pack /day. Advised patient to see a podiatrist to evaluate the mass on the top  of her right foot . Follow up in 1 month.

## 2012-01-12 ENCOUNTER — Encounter: Payer: Self-pay | Admitting: Physical Medicine and Rehabilitation

## 2012-01-12 ENCOUNTER — Encounter
Payer: Medicaid Other | Attending: Physical Medicine and Rehabilitation | Admitting: Physical Medicine and Rehabilitation

## 2012-01-12 VITALS — BP 124/62 | HR 97 | Resp 16 | Ht 60.0 in | Wt 137.2 lb

## 2012-01-12 DIAGNOSIS — M069 Rheumatoid arthritis, unspecified: Secondary | ICD-10-CM | POA: Insufficient documentation

## 2012-01-12 DIAGNOSIS — G609 Hereditary and idiopathic neuropathy, unspecified: Secondary | ICD-10-CM | POA: Insufficient documentation

## 2012-01-12 DIAGNOSIS — E119 Type 2 diabetes mellitus without complications: Secondary | ICD-10-CM | POA: Insufficient documentation

## 2012-01-12 DIAGNOSIS — J449 Chronic obstructive pulmonary disease, unspecified: Secondary | ICD-10-CM | POA: Insufficient documentation

## 2012-01-12 DIAGNOSIS — M79609 Pain in unspecified limb: Secondary | ICD-10-CM

## 2012-01-12 DIAGNOSIS — M810 Age-related osteoporosis without current pathological fracture: Secondary | ICD-10-CM | POA: Insufficient documentation

## 2012-01-12 DIAGNOSIS — I2510 Atherosclerotic heart disease of native coronary artery without angina pectoris: Secondary | ICD-10-CM | POA: Insufficient documentation

## 2012-01-12 DIAGNOSIS — I252 Old myocardial infarction: Secondary | ICD-10-CM | POA: Insufficient documentation

## 2012-01-12 DIAGNOSIS — M1990 Unspecified osteoarthritis, unspecified site: Secondary | ICD-10-CM

## 2012-01-12 DIAGNOSIS — M545 Low back pain, unspecified: Secondary | ICD-10-CM

## 2012-01-12 DIAGNOSIS — E7800 Pure hypercholesterolemia, unspecified: Secondary | ICD-10-CM | POA: Insufficient documentation

## 2012-01-12 DIAGNOSIS — G8929 Other chronic pain: Secondary | ICD-10-CM | POA: Insufficient documentation

## 2012-01-12 DIAGNOSIS — M79671 Pain in right foot: Secondary | ICD-10-CM

## 2012-01-12 DIAGNOSIS — F17200 Nicotine dependence, unspecified, uncomplicated: Secondary | ICD-10-CM | POA: Insufficient documentation

## 2012-01-12 DIAGNOSIS — J4489 Other specified chronic obstructive pulmonary disease: Secondary | ICD-10-CM | POA: Insufficient documentation

## 2012-01-12 MED ORDER — OXYCODONE-ACETAMINOPHEN 10-325 MG PO TABS: 1.0000 | ORAL_TABLET | ORAL | Status: DC | PRN

## 2012-01-12 MED ORDER — PREGABALIN 25 MG PO CAPS: 25.0000 mg | ORAL_CAPSULE | Freq: Two times a day (BID) | ORAL | Status: DC

## 2012-01-12 NOTE — Progress Notes (Signed)
Subjective:    Patient ID: Beth [name], female    DOB: 10/11/1950, 61 y.o.   MRN: [id number]  HPI The patient complains about chronic back pain . The patient denies any radiation into her LEs. The patient also complains about numbness and tingling in her feet.  The problem has been stable. The patient has not followup with the podiatrist to evaluate the mass on her right foot, yet.  Pain Inventory Average Pain 7 Pain Right Now 8 My pain is sharp and aching  In the last 24 hours, has pain interfered with the following? General activity 8 Relation with others 8 Enjoyment of life 8 What TIME of day is your pain at its worst? morning and night Sleep (in general) Poor  Pain is worse with: walking, sitting and standing Pain improves with: medication Relief from Meds: 6  Mobility use a cane how many minutes can you walk? 5 ability to climb steps?  no do you drive?  yes  Function disabled: date disabled 2006 I need assistance with the following:  meal prep, household duties and shopping  Neuro/Psych numbness trouble walking depression  Prior Studies Any changes since last visit?  no  Physicians involved in your care Any changes since last visit?  no   Family History  Problem Relation Age of Onset  . COPD Mother   . Heart disease Father   . Diabetes Sister   . COPD Brother   . Diabetes Sister   . Diabetes Brother    History   Social History  . Marital Status: Divorced    Spouse Name: N/A    Number of Children: N/A  . Years of Education: N/A   Social History Main Topics  . Smoking status: Current Everyday Smoker -- 1.0 packs/day for 45 years    Types: Cigarettes  . Smokeless tobacco: Never Used  . Alcohol Use: No  . Drug Use: No  . Sexually Active: None   Other Topics Concern  . None   Social History Narrative  . None   Past Surgical History  Procedure Date  . Appendectomy   . Abdominal hysterectomy   . Stents 2007    5 cardiac stents    Past Medical History  Diagnosis Date  . CAD (coronary artery disease)   . Acute MI 2006  . Hypercholesterolemia   . Rheumatoid arthritis   . Tobacco abuse   . COPD (chronic obstructive pulmonary disease)   . Bronchitis, chronic   . Diabetes mellitus   . Atrial fibrillation   . Osteoporosis   . Multiple URI    BP 124/62  Pulse 97  Resp 16  Ht 5' (1.524 m)  Wt 137 lb 3.2 oz (62.234 kg)  BMI 26.80 kg/m2  SpO2 93%    Review of Systems  Respiratory: Positive for apnea.   Gastrointestinal: Positive for nausea and vomiting.  Musculoskeletal: Positive for back pain and gait problem.  Neurological: Positive for numbness.  Psychiatric/Behavioral: Positive for dysphoric mood.  All other systems reviewed and are negative.       Objective:   Physical Exam  Constitutional: She is oriented to person, place, and time. She appears well-developed.  HENT:  Head: Normocephalic.  Eyes: Pupils are equal, round, and reactive to light.  Neck: Neck supple.  Neurological: She is alert and oriented to person, place, and time.  Skin: Skin is warm and dry.  Psychiatric: She has a normal mood and affect.   Symmetric normal motor tone is noted  throughout. Normal muscle bulk. Muscle testing reveals 5/5 muscle strength of the upper extremity, and 5/5 of the lower extremity. Full range of motion in upper and lower extremities, except right wrist . ROM of spine is Restricted, kyphotic. Deformity of right wrist.  DTR in the upper and lower extremity are present and symmetric 2+. No clonus is noted.  Patient arises from chair with difficulty. Wide based gait with a cane, thoracic kyphosis ++ .  Extreme tenderness to even light touch on her entire back.        Assessment & Plan:  This is a 61 year old female with  1.LBP  2.Peripheral neuropathy  3.RA  4.DM  Plan :  Continue with medications, advised patient to cut down further on her smoking, she states, that she is down to half a pack  per day now. Referred patient to  a podiatrist to evaluate the mass on the top of her right foot . Refilled her oxycodone and Lyrica. Advised patient to do some exercises and stretches in her hot tub, if she can safely get in and out of it. Follow up in 1 month.

## 2012-01-12 NOTE — Patient Instructions (Signed)
Continue with your walking program, follow up with podiatrist for right foot mass. Continue with medication. It would be very beneficial for your RA and other complains, that you do some exercises/ stretches in your hot tub.

## 2012-01-13 ENCOUNTER — Telehealth: Payer: Self-pay | Admitting: *Deleted

## 2012-01-13 NOTE — Telephone Encounter (Signed)
Clarification on Lyrica instructions

## 2012-01-13 NOTE — Telephone Encounter (Signed)
Rx should be 2 capsules BID, clarification given to [facility].

## 2012-01-26 ENCOUNTER — Other Ambulatory Visit: Payer: Self-pay | Admitting: Physical Medicine and Rehabilitation

## 2012-02-03 ENCOUNTER — Telehealth: Payer: Self-pay | Admitting: Physical Medicine and Rehabilitation

## 2012-02-03 NOTE — Telephone Encounter (Signed)
Ok to do referral?  

## 2012-02-03 NOTE — Telephone Encounter (Signed)
Needs a referral to [facility].

## 2012-02-09 ENCOUNTER — Other Ambulatory Visit: Payer: Self-pay

## 2012-02-09 ENCOUNTER — Encounter: Payer: Medicaid Other | Admitting: Physical Medicine and Rehabilitation

## 2012-02-09 MED ORDER — OXYCODONE-ACETAMINOPHEN 10-325 MG PO TABS: 1.0000 | ORAL_TABLET | ORAL | Status: DC | PRN

## 2012-02-09 NOTE — Telephone Encounter (Signed)
Pt is sick and cannot come to her appt.  Oxy will be refilled and her daughter [name] will pick it up.

## 2012-03-07 ENCOUNTER — Other Ambulatory Visit: Payer: Self-pay | Admitting: Physical Medicine and Rehabilitation

## 2012-03-07 ENCOUNTER — Encounter: Payer: Self-pay | Admitting: Physical Medicine and Rehabilitation

## 2012-03-07 ENCOUNTER — Encounter
Payer: Medicaid Other | Attending: Physical Medicine and Rehabilitation | Admitting: Physical Medicine and Rehabilitation

## 2012-03-07 VITALS — BP 152/73 | HR 83 | Resp 18 | Ht <= 58 in | Wt 139.0 lb

## 2012-03-07 DIAGNOSIS — G8929 Other chronic pain: Secondary | ICD-10-CM | POA: Insufficient documentation

## 2012-03-07 DIAGNOSIS — M25579 Pain in unspecified ankle and joints of unspecified foot: Secondary | ICD-10-CM

## 2012-03-07 DIAGNOSIS — M069 Rheumatoid arthritis, unspecified: Secondary | ICD-10-CM | POA: Insufficient documentation

## 2012-03-07 DIAGNOSIS — M25539 Pain in unspecified wrist: Secondary | ICD-10-CM

## 2012-03-07 DIAGNOSIS — M25529 Pain in unspecified elbow: Secondary | ICD-10-CM | POA: Insufficient documentation

## 2012-03-07 MED ORDER — OXYCODONE-ACETAMINOPHEN 10-325 MG PO TABS: 1.0000 | ORAL_TABLET | ORAL | Status: DC | PRN

## 2012-03-07 NOTE — Progress Notes (Signed)
Subjective:    Patient ID: Beth [name], female    DOB: 05-09-1951, 61 y.o.   MRN: [id number]   The patient is a 61 year old woman who is followed here at the center for pain and rehabilitative medicine. She has a history of rheumatoid arthritis with predominant involvement of her wrist elbows knees ankles and low back. She has significant deformity in the wrist and hands as well as ankles.  She also has a history of diabetes mellitus and peripheral neuropathy and has burning feet as well.  Her average pain is between 7 and 8 on a scale of 10 and is described as aching. Joint pain interfered significantly with activity levels. Sleep is fair pain is worse of activities in general rest and medication he uses it somewhat.  She reports generally good relief with the use of current medications.  She currently uses a cane for ambulation and can walk close to 10 minutes she has difficulty with stairs and is able to drive. She is independent with feeding dressing bathing and toileting. She can do some light meal preparation as well.  She admits to some depression and has a history of a suicide attempt at age 61. She states although she thinks about it occasionally she has no intention of carrying out these thoughts. She states she has been adequate support system with friends and family. She is not interested in referral to behavioral health at this time but will let me know she changes her mind.        Pain Inventory Average Pain 8 Pain Right Now 8 My pain is sharp and aching  In the last 24 hours, has pain interfered with the following? General activity 9 Relation with others 9 Enjoyment of life 9 What TIME of day is your pain at its worst? in the morning and at night Sleep (in general) Fair  Pain is worse with: walking and bending Pain improves with: medication Relief from Meds: 6  Mobility use a cane how many minutes can you walk? 5 ability to climb steps?  no do you drive?   yes Do you have any goals in this area?  no  Function disabled: date disabled 2006 Was working as a Chief Operating Officer; job ended; tearful about this "feel useless"  Neuro/Psych numbness trouble walking depression  Prior Studies Any changes since last visit?  no  Physicians involved in your care Any changes since last visit?  no   Family History  Problem Relation Age of Onset  . COPD Mother   . Heart disease Father   . Diabetes Sister   . COPD Brother   . Diabetes Sister   . Diabetes Brother    History   Social History  . Marital Status: Divorced    Spouse Name: N/A    Number of Children: N/A  . Years of Education: N/A   Social History Main Topics  . Smoking status: Current Everyday Smoker -- 1.0 packs/day for 45 years    Types: Cigarettes  . Smokeless tobacco: Never Used  . Alcohol Use: No  . Drug Use: No  . Sexually Active: None   Other Topics Concern  . None   Social History Narrative  . None   Past Surgical History  Procedure Date  . Appendectomy   . Abdominal hysterectomy   . Stents 2007    5 cardiac stents   Past Medical History  Diagnosis Date  . CAD (coronary artery disease)   . Acute MI 2006  .  Hypercholesterolemia   . Rheumatoid arthritis   . Tobacco abuse   . COPD (chronic obstructive pulmonary disease)   . Bronchitis, chronic   . Diabetes mellitus   . Atrial fibrillation   . Osteoporosis   . Multiple URI    BP 152/73  Pulse 83  Resp 18  Ht 4\' 10"  (1.473 m)  Wt 139 lb (63.05 kg)  BMI 29.05 kg/m2  SpO2 96%   HPI    Review of Systems  Constitutional: Negative.   HENT: Negative.   Eyes: Negative.   Respiratory: Negative.   Cardiovascular: Negative.   Gastrointestinal: Positive for nausea (every evening), vomiting and diarrhea.  Genitourinary: Negative.   Musculoskeletal: Positive for myalgias, back pain, arthralgias and gait problem.  Skin:       Large growth on rt foot; to be removed; waiting on app't with foot surgeon   Neurological: Negative.   Hematological: Negative.   Psychiatric/Behavioral: Positive for dysphoric mood.       Objective:   Physical Exam  She is well-developed, well-nourished woman, who does not  appear in any distress. She is oriented x3. Speech is clear. Affect  is bright. She is alert, cooperative, and pleasant. Follows commands  without difficulty. Answers my questions appropriately.  Cranial nerves, coordination are intact.  Reflexes are  diminished at the patellar and Achilles tendons. No abnormal tone,  clonus, or tremors are noted.  Her motor strength is overall good in  both lower extremities without obvious focal deficit.  Reports no sensory deficit to light touch.  R ankle deformity secondary to synovitis no erythema.  R wrist deformity and decreased ROM, no erythema.  She is able to transition from sitting to standing.  Her gait is stable without an assistive device at this time.  She has some difficulty with tandem gait, but Romberg test is performed  adequately.  Mild spinal tenderness is appreciated with palpation throughout thoracic and lumbar spine.        Assessment & Plan:  1. Rheumatoid arthritis with multiple joints involved. She states she still being followed by Dr. Lanell Matar over at [facility]. She is taking ENBREL as well as methotrexate per his instruction.    2. Lumbago with a history of osteopenia and multiple vertebralplasty.    3. Mild spinal stenosis    4. Peripheral neuropathy with burning feet.     No reported significant side effects of Opioid medications noted.   Patient had 2 pills left. She should have 15 left. She states she has 2 days worth of pills in her pill box at home are at home.  Reminded to bring in all pills for pill counts to each visit.  Patient cautioned regarding operation of machinery or vehicles.  Patient understands risk and benefits of these medications.  There is no indication or report of risk to self or  others.   Follow up next month for refill of medication and drug monitoring.  Have encouraged her to maintain contact with her primary care and her rheumatologist.  She states she is having some financial difficulties and is having difficulty getting a ride over 2 instance [place] to be seen by her rheumatologist.   Last UDS:  09/23/2011 at 11:34 AM Consistent

## 2012-03-07 NOTE — Patient Instructions (Addendum)
Your pain medications as prescribed.  Please make sure you keep your pain medications locked up and in a secure location  You must bring in all of your pills for pill counts to each of your visits so we can monitor her your pain medication use.  Please maintain contact with your primary care Dr.  Please maintain contact with your rheumatologist.

## 2012-03-07 NOTE — Addendum Note (Signed)
Addended by: Caryl Ada on: 03/07/2012 01:18 PM   Modules accepted: Orders

## 2012-03-09 ENCOUNTER — Telehealth: Payer: Self-pay | Admitting: Physical Medicine and Rehabilitation

## 2012-03-09 MED ORDER — PREGABALIN 25 MG PO CAPS: 50.0000 mg | ORAL_CAPSULE | Freq: Two times a day (BID) | ORAL | Status: DC

## 2012-03-09 NOTE — Telephone Encounter (Signed)
Patient called in needing a refill on her Lyrica any questions please call her at [phone]

## 2012-03-19 ENCOUNTER — Telehealth: Payer: Self-pay | Admitting: Family Medicine

## 2012-03-19 NOTE — Telephone Encounter (Signed)
Called after hour line. Thinks she has UTI. Had plenty before. Wants to know if abx can be called into pharmacy. Informed her that no antibiotics and be called in w/o evaluating her. 1. Go to urgent care today 2. Wait and call in tomorrow for work in visit.   Patient agreed with plan and voiced understanding.

## 2012-03-22 ENCOUNTER — Telehealth: Payer: Self-pay | Admitting: Physical Medicine and Rehabilitation

## 2012-03-22 ENCOUNTER — Ambulatory Visit (INDEPENDENT_AMBULATORY_CARE_PROVIDER_SITE_OTHER): Payer: Medicaid Other | Admitting: Family Medicine

## 2012-03-22 ENCOUNTER — Encounter: Payer: Self-pay | Admitting: Family Medicine

## 2012-03-22 VITALS — BP 111/80 | HR 102 | Temp 97.9°F | Ht <= 58 in | Wt 133.0 lb

## 2012-03-22 DIAGNOSIS — R300 Dysuria: Secondary | ICD-10-CM

## 2012-03-22 DIAGNOSIS — E785 Hyperlipidemia, unspecified: Secondary | ICD-10-CM

## 2012-03-22 DIAGNOSIS — E119 Type 2 diabetes mellitus without complications: Secondary | ICD-10-CM

## 2012-03-22 LAB — POCT UA - MICROSCOPIC ONLY

## 2012-03-22 LAB — LIPID PANEL
HDL: 30 mg/dL — ABNORMAL LOW (ref >39)
LDL Cholesterol: 120 mg/dL — ABNORMAL HIGH (ref 0–99)
Triglycerides: 186 mg/dL — ABNORMAL HIGH (ref <150)
VLDL: 37 mg/dL (ref 0–40)

## 2012-03-22 LAB — POCT URINALYSIS DIPSTICK
Nitrite, UA: NEGATIVE
Protein, UA: 100
Urobilinogen, UA: 0.2
pH, UA: 5.5

## 2012-03-22 MED ORDER — METFORMIN HCL 500 MG PO TABS: 500.0000 mg | ORAL_TABLET | Freq: Two times a day (BID) | ORAL | Status: DC

## 2012-03-22 MED ORDER — CEPHALEXIN 500 MG PO CAPS: 500.0000 mg | ORAL_CAPSULE | Freq: Two times a day (BID) | ORAL | Status: AC

## 2012-03-22 NOTE — Progress Notes (Signed)
  Subjective:    Patient ID: Beth [name], female    DOB: 04/22/1951, 61 y.o.   MRN: [id number]  HPI Work-in  # Dysuria and lower abdominal pain for 10 days She complains of subjective fevers but denies chills She has chronic back pain but denies new back pain   Review of Systems Per HPI Denies nausea/vomiting/constipation. Her appetite has been normal.  Denies vaginal irritation. She is not sexually active.   Allergies, medication, past medical history reviewed.  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 She has not been able to come into the clinic for refills. -HLD -Tobacco--she has cut back to 1/2 ppd -CAD -RA--on immunosuppressants -Weight loss     Objective:   Physical Exam GEN: NAD; chronically ill-appearing BACK: some mid-line tenderness (chronic); no CVA tenderness ABD: mild lower abdominal tenderness without guarding; soft    Assessment & Plan:

## 2012-03-22 NOTE — Patient Instructions (Addendum)
Follow-up with Dr. Tye Savoy She can discuss your cholesterol and diabetes labs with you   Resume your metformin  Take the antibiotic for your urine infection If you have fever or back pain, return to clinic due to concern for worsening urine infection  Follow-up in 6 weeks for a nurse visit to repeat your urine studies

## 2012-03-22 NOTE — Assessment & Plan Note (Addendum)
She has another UTI (she had one in 05/2011). She is chronically immunosuppressed due to RA medications. She is also a heavy tobacco user and diabetic. She has been non-compliant with her oral diabetes medications for several months.  -Will try Keflex x 10 days -Will check urine culture>>>pan-sensitive E. coli   -She was given indications to RTC, especially signs of pyelonephritis

## 2012-03-22 NOTE — Telephone Encounter (Signed)
Pt aware that we are having to do a prior authorization for lyrica.   I am going to ask Dr. Pamelia Hoit in the morning if going back on gabapentin is an option for the pt.

## 2012-03-22 NOTE — Telephone Encounter (Signed)
Calling about Lyrica, has not been called in yet.  It has been 2 weeks

## 2012-03-23 MED ORDER — GABAPENTIN 300 MG PO CAPS: 300.0000 mg | ORAL_CAPSULE | Freq: Four times a day (QID) | ORAL | Status: DC

## 2012-03-23 NOTE — Telephone Encounter (Signed)
Per Dr. Pamelia Hoit, going back on gabapentin is okay. This will be sent to her pharmacy.

## 2012-04-01 ENCOUNTER — Telehealth: Payer: Self-pay | Admitting: Family Medicine

## 2012-04-01 NOTE — Telephone Encounter (Signed)
Please call patient to schedule an appointment with me to discuss Diabetes.  Thanks!

## 2012-04-02 NOTE — Telephone Encounter (Signed)
Attempted to call patient so that appointment can be scheduled,

## 2012-04-02 NOTE — Telephone Encounter (Signed)
[phone] is disconnected and other number had a busy signal

## 2012-04-05 ENCOUNTER — Encounter: Payer: Self-pay | Admitting: Physical Medicine and Rehabilitation

## 2012-04-05 ENCOUNTER — Encounter
Payer: Medicaid Other | Attending: Physical Medicine and Rehabilitation | Admitting: Physical Medicine and Rehabilitation

## 2012-04-05 VITALS — BP 126/76 | HR 92 | Resp 14 | Ht <= 58 in | Wt 132.0 lb

## 2012-04-05 DIAGNOSIS — M545 Low back pain, unspecified: Secondary | ICD-10-CM | POA: Insufficient documentation

## 2012-04-05 DIAGNOSIS — M810 Age-related osteoporosis without current pathological fracture: Secondary | ICD-10-CM | POA: Insufficient documentation

## 2012-04-05 DIAGNOSIS — E119 Type 2 diabetes mellitus without complications: Secondary | ICD-10-CM | POA: Insufficient documentation

## 2012-04-05 DIAGNOSIS — F17200 Nicotine dependence, unspecified, uncomplicated: Secondary | ICD-10-CM | POA: Insufficient documentation

## 2012-04-05 DIAGNOSIS — M069 Rheumatoid arthritis, unspecified: Secondary | ICD-10-CM | POA: Insufficient documentation

## 2012-04-05 DIAGNOSIS — G609 Hereditary and idiopathic neuropathy, unspecified: Secondary | ICD-10-CM | POA: Insufficient documentation

## 2012-04-05 DIAGNOSIS — J449 Chronic obstructive pulmonary disease, unspecified: Secondary | ICD-10-CM | POA: Insufficient documentation

## 2012-04-05 DIAGNOSIS — E7800 Pure hypercholesterolemia, unspecified: Secondary | ICD-10-CM | POA: Insufficient documentation

## 2012-04-05 DIAGNOSIS — M21969 Unspecified acquired deformity of unspecified lower leg: Secondary | ICD-10-CM

## 2012-04-05 DIAGNOSIS — J4489 Other specified chronic obstructive pulmonary disease: Secondary | ICD-10-CM | POA: Insufficient documentation

## 2012-04-05 DIAGNOSIS — R229 Localized swelling, mass and lump, unspecified: Secondary | ICD-10-CM | POA: Insufficient documentation

## 2012-04-05 DIAGNOSIS — G8929 Other chronic pain: Secondary | ICD-10-CM | POA: Insufficient documentation

## 2012-04-05 DIAGNOSIS — I252 Old myocardial infarction: Secondary | ICD-10-CM | POA: Insufficient documentation

## 2012-04-05 DIAGNOSIS — R209 Unspecified disturbances of skin sensation: Secondary | ICD-10-CM | POA: Insufficient documentation

## 2012-04-05 DIAGNOSIS — I2510 Atherosclerotic heart disease of native coronary artery without angina pectoris: Secondary | ICD-10-CM | POA: Insufficient documentation

## 2012-04-05 MED ORDER — OXYCODONE-ACETAMINOPHEN 10-325 MG PO TABS: 1.0000 | ORAL_TABLET | ORAL | Status: DC | PRN

## 2012-04-05 NOTE — Patient Instructions (Signed)
Try to be as active as possible, try to walk as much as tolerated.

## 2012-04-05 NOTE — Progress Notes (Signed)
Subjective:    Patient ID: Beth [name], female    DOB: Sep 22, 1950, 61 y.o.   MRN: [id number]  HPI The patient complains about chronic back pain . The patient denies any radiation into her LEs. The patient also complains about numbness and tingling in her feet.  The problem has been stable. The patient has not followup with the podiatrist to evaluate the mass on her right foot, yet. She reports, that she never got a call for an appointment, and that she called our office twice, but was not called back.   Pain Inventory Average Pain 9 Pain Right Now 9 My pain is constant  In the last 24 hours, has pain interfered with the following? General activity 9 Relation with others 9 Enjoyment of life 9 What TIME of day is your pain at its worst? night Sleep (in general) Poor  Pain is worse with: some activites Pain improves with: medication Relief from Meds: 8  Mobility use a cane  Function disabled: date disabled  I need assistance with the following:  dressing, bathing, toileting, meal prep, household duties and shopping  Neuro/Psych weakness  Prior Studies Any changes since last visit?  no  Physicians involved in your care Any changes since last visit?  no   Family History  Problem Relation Age of Onset  . COPD Mother   . Heart disease Father   . Diabetes Sister   . COPD Brother   . Diabetes Sister   . Diabetes Brother    History   Social History  . Marital Status: Divorced    Spouse Name: N/A    Number of Children: N/A  . Years of Education: N/A   Social History Main Topics  . Smoking status: Current Every Day Smoker -- 0.5 packs/day for 45 years    Types: Cigarettes  . Smokeless tobacco: Never Used  . Alcohol Use: No  . Drug Use: No  . Sexually Active: None   Other Topics Concern  . None   Social History Narrative  . None   Past Surgical History  Procedure Date  . Appendectomy   . Abdominal hysterectomy   . Stents 2007    5 cardiac stents     Past Medical History  Diagnosis Date  . CAD (coronary artery disease)   . Acute MI 2006  . Hypercholesterolemia   . Rheumatoid arthritis   . Tobacco abuse   . COPD (chronic obstructive pulmonary disease)   . Bronchitis, chronic   . Diabetes mellitus   . Atrial fibrillation   . Osteoporosis   . Multiple URI    BP 126/76  Pulse 92  Resp 14  Ht 4\' 10"  (1.473 m)  Wt 132 lb (59.875 kg)  BMI 27.59 kg/m2  SpO2 96%   Review of Systems  Gastrointestinal: Positive for nausea.  Musculoskeletal: Positive for back pain and arthralgias.  All other systems reviewed and are negative.       Objective:   Physical Exam Constitutional: She is oriented to person, place, and time. She appears well-developed.  HENT:  Head: Normocephalic.  Eyes: Pupils are equal, round, and reactive to light.  Neck: Neck supple.  Neurological: She is alert and oriented to person, place, and time.  Skin: Skin is warm and dry.  Psychiatric: She has a normal mood and affect.  Symmetric normal motor tone is noted throughout. Normal muscle bulk. Muscle testing reveals 5/5 muscle strength of the upper extremity, and 5/5 of the lower extremity. Full range  of motion in upper and lower extremities, except right wrist . ROM of spine is Restricted, kyphotic. Deformity of right wrist.  DTR in the upper and lower extremity are present and symmetric 2+. No clonus is noted.  Patient arises from chair with difficulty. Wide based gait with a cane, thoracic kyphosis ++ .  Extreme tenderness to even light touch on her entire back.  Hard mass on right foot.        Assessment & Plan:  This is a 61 year old female with  1.LBP  2.Peripheral neuropathy  3.RA  4.DM  Plan :  Continue with medications, advised patient to cut down further on her smoking, she states, that she is down to half a pack per day now. Referred patient to a podiatrist to evaluate the mass on the top of her right foot again, I made sure that the  front desk will make sure that she will be seen . Refilled her oxycodone . Advised patient to do some exercises and stretches in her hot tub, if she can safely get in and out of it. Also she should stay as active as possible and walk as much as tolerated. Follow up in 1 month.

## 2012-04-12 ENCOUNTER — Other Ambulatory Visit: Payer: Self-pay | Admitting: Physical Medicine and Rehabilitation

## 2012-05-04 ENCOUNTER — Encounter: Payer: Self-pay | Admitting: Physical Medicine and Rehabilitation

## 2012-05-04 ENCOUNTER — Encounter
Payer: Medicaid Other | Attending: Physical Medicine and Rehabilitation | Admitting: Physical Medicine and Rehabilitation

## 2012-05-04 VITALS — BP 115/65 | HR 78 | Resp 14 | Ht 59.0 in | Wt 137.0 lb

## 2012-05-04 DIAGNOSIS — M79609 Pain in unspecified limb: Secondary | ICD-10-CM | POA: Insufficient documentation

## 2012-05-04 DIAGNOSIS — M25579 Pain in unspecified ankle and joints of unspecified foot: Secondary | ICD-10-CM

## 2012-05-04 DIAGNOSIS — G609 Hereditary and idiopathic neuropathy, unspecified: Secondary | ICD-10-CM | POA: Insufficient documentation

## 2012-05-04 DIAGNOSIS — M549 Dorsalgia, unspecified: Secondary | ICD-10-CM

## 2012-05-04 DIAGNOSIS — M25559 Pain in unspecified hip: Secondary | ICD-10-CM | POA: Insufficient documentation

## 2012-05-04 DIAGNOSIS — M069 Rheumatoid arthritis, unspecified: Secondary | ICD-10-CM | POA: Insufficient documentation

## 2012-05-04 DIAGNOSIS — R209 Unspecified disturbances of skin sensation: Secondary | ICD-10-CM | POA: Insufficient documentation

## 2012-05-04 DIAGNOSIS — E119 Type 2 diabetes mellitus without complications: Secondary | ICD-10-CM | POA: Insufficient documentation

## 2012-05-04 DIAGNOSIS — G8929 Other chronic pain: Secondary | ICD-10-CM | POA: Insufficient documentation

## 2012-05-04 MED ORDER — PREGABALIN 25 MG PO CAPS: 75.0000 mg | ORAL_CAPSULE | Freq: Two times a day (BID) | ORAL | Status: DC

## 2012-05-04 MED ORDER — OXYCODONE-ACETAMINOPHEN 10-325 MG PO TABS: 1.0000 | ORAL_TABLET | ORAL | Status: DC | PRN

## 2012-05-04 NOTE — Patient Instructions (Signed)
Apply heat to your lower back for pain relief, continue with stretching exercises.

## 2012-05-04 NOTE — Progress Notes (Signed)
Subjective:    Patient ID: Beth [name], female    DOB: 11/23/1950, 61 y.o.   MRN: [id number]  HPI The patient complains about chronic back pain . The patient denies any radiation into her LEs. The patient also complains about numbness and tingling in her feet.  The problem has been stable. The patient has not followup with the podiatrist to evaluate the mass on her right foot, yet. She reports, that she got the information now, that her PCP has to refer her, that we can not .  Pain Inventory Average Pain 8 Pain Right Now 8 My pain is intermittent  In the last 24 hours, has pain interfered with the following? General activity 9 Relation with others 9 Enjoyment of life 9 What TIME of day is your pain at its worst? morning and night Sleep (in general) Fair  Pain is worse with: walking and standing Pain improves with: medication Relief from Meds: 5  Mobility use a cane how many minutes can you walk? 5 ability to climb steps?  no do you drive?  yes Do you have any goals in this area?  no  Function Do you have any goals in this area?  no  Neuro/Psych anxiety  Prior Studies Any changes since last visit?  no  Physicians involved in your care Any changes since last visit?  no   Family History  Problem Relation Age of Onset  . COPD Mother   . Heart disease Father   . Diabetes Sister   . COPD Brother   . Diabetes Sister   . Diabetes Brother    History   Social History  . Marital Status: Divorced    Spouse Name: N/A    Number of Children: N/A  . Years of Education: N/A   Social History Main Topics  . Smoking status: Current Every Day Smoker -- 0.5 packs/day for 45 years    Types: Cigarettes  . Smokeless tobacco: Never Used  . Alcohol Use: No  . Drug Use: No  . Sexually Active: None   Other Topics Concern  . None   Social History Narrative  . None   Past Surgical History  Procedure Date  . Appendectomy   . Abdominal hysterectomy   . Stents 2007      5 cardiac stents   Past Medical History  Diagnosis Date  . CAD (coronary artery disease)   . Acute MI 2006  . Hypercholesterolemia   . Rheumatoid arthritis   . Tobacco abuse   . COPD (chronic obstructive pulmonary disease)   . Bronchitis, chronic   . Diabetes mellitus   . Atrial fibrillation   . Osteoporosis   . Multiple URI    BP 115/65  Pulse 78  Resp 14  Ht 4\' 11"  (1.499 m)  Wt 137 lb (62.143 kg)  BMI 27.67 kg/m2  SpO2 95%     Review of Systems  Gastrointestinal: Positive for nausea.  Musculoskeletal: Positive for myalgias, back pain, arthralgias and gait problem.  Psychiatric/Behavioral: The patient is nervous/anxious.   All other systems reviewed and are negative.       Objective:   Physical Exam Constitutional: She is oriented to person, place, and time. She appears well-developed.  HENT:  Head: Normocephalic.  Eyes: Pupils are equal, round, and reactive to light.  Neck: Neck supple.  Neurological: She is alert and oriented to person, place, and time.  Skin: Skin is warm and dry.  Psychiatric: She has a normal mood and affect.  Symmetric normal motor tone is noted throughout. Normal muscle bulk. Muscle testing reveals 5/5 muscle strength of the upper extremity, and 5/5 of the lower extremity. Full range of motion in upper and lower extremities, except right wrist . ROM of spine is Restricted, kyphotic. Deformity of right wrist.  DTR in the upper and lower extremity are present and symmetric 2+. No clonus is noted.  Patient arises from chair with difficulty. Wide based gait with a cane, thoracic kyphosis ++ .  Extreme tenderness to even light touch on her entire back.  Hard mass on right foot.        Assessment & Plan:  This is a 61 year old female with  1.LBP , some increased LBP, with radiating burning pain into her right posterior hip, increased her Lyrica to 75mg  bid. 2.Peripheral neuropathy  3.RA  4.DM  Plan :  Continue with medications,  advised patient to cut down further on her smoking, she states, that she is down to half a pack per day now. Referred patient to a podiatrist to evaluate the mass on the top of her right foot several times, just now she got the information that her PCP has to refer her, she will see him on the 25th.  Refilled her oxycodone . Advised patient to do some exercises and stretches in her hot tub, if she can safely get in and out of it. Also she should stay as active as possible and walk as much as tolerated.  Follow up in 1 month.

## 2012-05-11 ENCOUNTER — Encounter: Payer: Self-pay | Admitting: Family Medicine

## 2012-05-11 ENCOUNTER — Other Ambulatory Visit: Payer: Self-pay | Admitting: Family Medicine

## 2012-05-11 ENCOUNTER — Ambulatory Visit (INDEPENDENT_AMBULATORY_CARE_PROVIDER_SITE_OTHER): Payer: Medicaid Other | Admitting: Family Medicine

## 2012-05-11 VITALS — BP 141/89 | HR 81 | Temp 98.1°F | Ht 59.0 in | Wt 138.1 lb

## 2012-05-11 DIAGNOSIS — Z23 Encounter for immunization: Secondary | ICD-10-CM

## 2012-05-11 MED ORDER — CLOPIDOGREL BISULFATE 75 MG PO TABS: 75.0000 mg | ORAL_TABLET | Freq: Every day | ORAL | Status: DC

## 2012-05-11 MED ORDER — METFORMIN HCL 500 MG PO TABS: 500.0000 mg | ORAL_TABLET | Freq: Two times a day (BID) | ORAL | Status: DC

## 2012-05-11 MED ORDER — SIMVASTATIN 40 MG PO TABS: 40.0000 mg | ORAL_TABLET | Freq: Every day | ORAL | Status: DC

## 2012-05-11 MED ORDER — GLIPIZIDE 10 MG PO TABS: 10.0000 mg | ORAL_TABLET | Freq: Two times a day (BID) | ORAL | Status: DC

## 2012-05-16 NOTE — Progress Notes (Signed)
Patient rescheduled appt without being seen by MD.  Gaylene Brooks, RN

## 2012-05-24 ENCOUNTER — Ambulatory Visit: Payer: Medicaid Other | Admitting: Family Medicine

## 2012-05-28 ENCOUNTER — Encounter: Payer: Self-pay | Admitting: Home Health Services

## 2012-05-29 ENCOUNTER — Encounter: Payer: Self-pay | Admitting: Home Health Services

## 2012-06-04 ENCOUNTER — Encounter
Payer: Medicaid Other | Attending: Physical Medicine and Rehabilitation | Admitting: Physical Medicine and Rehabilitation

## 2012-06-04 ENCOUNTER — Encounter: Payer: Self-pay | Admitting: Physical Medicine and Rehabilitation

## 2012-06-04 VITALS — BP 151/91 | HR 77 | Resp 14 | Ht 59.0 in | Wt 139.2 lb

## 2012-06-04 DIAGNOSIS — M549 Dorsalgia, unspecified: Secondary | ICD-10-CM | POA: Insufficient documentation

## 2012-06-04 DIAGNOSIS — M810 Age-related osteoporosis without current pathological fracture: Secondary | ICD-10-CM | POA: Insufficient documentation

## 2012-06-04 DIAGNOSIS — J4489 Other specified chronic obstructive pulmonary disease: Secondary | ICD-10-CM | POA: Insufficient documentation

## 2012-06-04 DIAGNOSIS — I2510 Atherosclerotic heart disease of native coronary artery without angina pectoris: Secondary | ICD-10-CM | POA: Insufficient documentation

## 2012-06-04 DIAGNOSIS — M069 Rheumatoid arthritis, unspecified: Secondary | ICD-10-CM | POA: Insufficient documentation

## 2012-06-04 DIAGNOSIS — R209 Unspecified disturbances of skin sensation: Secondary | ICD-10-CM | POA: Insufficient documentation

## 2012-06-04 DIAGNOSIS — I252 Old myocardial infarction: Secondary | ICD-10-CM | POA: Insufficient documentation

## 2012-06-04 DIAGNOSIS — M545 Low back pain: Secondary | ICD-10-CM

## 2012-06-04 DIAGNOSIS — G8929 Other chronic pain: Secondary | ICD-10-CM | POA: Insufficient documentation

## 2012-06-04 DIAGNOSIS — G609 Hereditary and idiopathic neuropathy, unspecified: Secondary | ICD-10-CM | POA: Insufficient documentation

## 2012-06-04 DIAGNOSIS — F17200 Nicotine dependence, unspecified, uncomplicated: Secondary | ICD-10-CM | POA: Insufficient documentation

## 2012-06-04 DIAGNOSIS — M1990 Unspecified osteoarthritis, unspecified site: Secondary | ICD-10-CM

## 2012-06-04 DIAGNOSIS — E119 Type 2 diabetes mellitus without complications: Secondary | ICD-10-CM | POA: Insufficient documentation

## 2012-06-04 DIAGNOSIS — J449 Chronic obstructive pulmonary disease, unspecified: Secondary | ICD-10-CM | POA: Insufficient documentation

## 2012-06-04 DIAGNOSIS — E7800 Pure hypercholesterolemia, unspecified: Secondary | ICD-10-CM | POA: Insufficient documentation

## 2012-06-04 MED ORDER — OXYCODONE-ACETAMINOPHEN 10-325 MG PO TABS: 1.0000 | ORAL_TABLET | ORAL | Status: DC | PRN

## 2012-06-04 NOTE — Patient Instructions (Signed)
Strongly advised patient again to follow up with her PCP for the mass on her foot !

## 2012-06-04 NOTE — Progress Notes (Signed)
Subjective:    Patient ID: Beth [name], female    DOB: [birth year], 61 y.o.   MRN: [id number]  HPI The patient complains about chronic back pain . The patient denies any radiation into her LEs. The patient also complains about numbness and tingling in her feet.  The problem has been stable. The patient has not followed up with her PCP to evaluate the mass/cyst ? on her right foot, yet.   Pain Inventory Average Pain 9 Pain Right Now 9 My pain is sharp and aching  In the last 24 hours, has pain interfered with the following? General activity 8 Relation with others 8 Enjoyment of life 8 What TIME of day is your pain at its worst? constant Sleep (in general) Poor  Pain is worse with: walking and standing Pain improves with: medication Relief from Meds: 5  Mobility use a cane how many minutes can you walk? 5 ability to climb steps?  no do you drive?  yes use a wheelchair needs help with transfers  Function disabled: date disabled  I need assistance with the following:  meal prep, household duties and shopping  Neuro/Psych No problems in this area  Prior Studies Any changes since last visit?  no  Physicians involved in your care Any changes since last visit?  no   Family History  Problem Relation Age of Onset  . COPD Mother   . Heart disease Father   . Diabetes Sister   . COPD Brother   . Diabetes Sister   . Diabetes Brother    History   Social History  . Marital Status: Divorced    Spouse Name: N/A    Number of Children: N/A  . Years of Education: N/A   Social History Main Topics  . Smoking status: Current Every Day Smoker -- 0.5 packs/day for 45 years    Types: Cigarettes  . Smokeless tobacco: Never Used  . Alcohol Use: No  . Drug Use: No  . Sexually Active: None   Other Topics Concern  . None   Social History Narrative  . None   Past Surgical History  Procedure Date  . Appendectomy   . Abdominal hysterectomy   . Stents 2007    5  cardiac stents   Past Medical History  Diagnosis Date  . CAD (coronary artery disease)   . Acute MI 2006  . Hypercholesterolemia   . Rheumatoid arthritis   . Tobacco abuse   . COPD (chronic obstructive pulmonary disease)   . Bronchitis, chronic   . Diabetes mellitus   . Atrial fibrillation   . Osteoporosis   . Multiple URI    BP 151/91  Pulse 77  Resp 14  Ht 4\' 11"  (1.499 m)  Wt 139 lb 3.2 oz (63.141 kg)  BMI 28.12 kg/m2  SpO2 98%   Review of Systems  Musculoskeletal: Positive for back pain, joint swelling, arthralgias and gait problem.  All other systems reviewed and are negative.       Objective:   Physical Exam Constitutional: She is oriented to person, place, and time. She appears well-developed.  HENT:  Head: Normocephalic.  Eyes: Pupils are equal, round, and reactive to light.  Neck: Neck supple.  Neurological: She is alert and oriented to person, place, and time.  Skin: Skin is warm and dry.  Psychiatric: She has a normal mood and affect.  Symmetric normal motor tone is noted throughout. Normal muscle bulk. Muscle testing reveals 5/5 muscle strength of the upper extremity,  and 5/5 of the lower extremity. Full range of motion in upper and lower extremities, except right wrist . ROM of spine is Restricted, kyphotic. Deformity of right wrist.  DTR in the upper and lower extremity are present and symmetric 2+. No clonus is noted.  Patient arises from chair with difficulty. Wide based gait with a cane, thoracic kyphosis ++ .  Extreme tenderness to even light touch on her entire back.  Soft  Mass/ cyst on right foot.        Assessment & Plan:  This is a 61 year old female with  1.LBP , some increased LBP, with radiating burning pain into her right posterior hip, increased her Lyrica to 75mg  bid.  2.Peripheral neuropathy  3.RA  4.DM  Plan :  Continue with medications, advised patient to cut down further on her smoking, she states, that she is down to half a  pack per day now. Patient has not seen her PCP to evaluate the mass on her foot yet . Strongly advised her to follow up asap with her PCP ! Refilled her oxycodone . Advised patient to do some exercises and stretches in her hot tub, if she can safely get in and out of it. Also she should stay as active as possible and walk as much as tolerated.  Follow up in 1 month.

## 2012-06-28 ENCOUNTER — Telehealth: Payer: Self-pay

## 2012-06-28 ENCOUNTER — Encounter
Payer: Medicaid Other | Attending: Physical Medicine and Rehabilitation | Admitting: Physical Medicine and Rehabilitation

## 2012-06-28 ENCOUNTER — Encounter: Payer: Self-pay | Admitting: Physical Medicine and Rehabilitation

## 2012-06-28 VITALS — BP 150/80 | HR 100 | Resp 18 | Ht 59.0 in | Wt 141.0 lb

## 2012-06-28 DIAGNOSIS — M25559 Pain in unspecified hip: Secondary | ICD-10-CM | POA: Insufficient documentation

## 2012-06-28 DIAGNOSIS — M069 Rheumatoid arthritis, unspecified: Secondary | ICD-10-CM | POA: Insufficient documentation

## 2012-06-28 DIAGNOSIS — M545 Low back pain, unspecified: Secondary | ICD-10-CM | POA: Insufficient documentation

## 2012-06-28 DIAGNOSIS — G629 Polyneuropathy, unspecified: Secondary | ICD-10-CM

## 2012-06-28 DIAGNOSIS — G8929 Other chronic pain: Secondary | ICD-10-CM | POA: Insufficient documentation

## 2012-06-28 DIAGNOSIS — E119 Type 2 diabetes mellitus without complications: Secondary | ICD-10-CM | POA: Insufficient documentation

## 2012-06-28 DIAGNOSIS — G609 Hereditary and idiopathic neuropathy, unspecified: Secondary | ICD-10-CM

## 2012-06-28 MED ORDER — PROMETHAZINE HCL 25 MG PO TABS: 25.0000 mg | ORAL_TABLET | ORAL | Status: DC

## 2012-06-28 MED ORDER — OXYCODONE-ACETAMINOPHEN 10-325 MG PO TABS: 1.0000 | ORAL_TABLET | ORAL | Status: DC | PRN

## 2012-06-28 NOTE — Patient Instructions (Signed)
Follow up with your PCP for your swelling /mass on your left foot.

## 2012-06-28 NOTE — Telephone Encounter (Signed)
Pharmacy had question regarding patients promethazine.  Per Beth [name] patient is to take 1 tablet daily as needed.

## 2012-06-28 NOTE — Telephone Encounter (Signed)
Pharmacy called with questions regarding one of patient prescriptions.

## 2012-06-28 NOTE — Progress Notes (Signed)
Subjective:    Patient ID: Beth [name], female    DOB: 09-29-1950, 61 y.o.   MRN: [id number]  HPI The patient complains about chronic back pain . The patient denies any radiation into her LEs. The patient also complains about numbness and tingling in her feet.  The problem has been stable. The patient has not followed up with her PCP to evaluate the mass/cyst ? on her right foot, yet. Although I have been telling her to follow up on this for several month now.  Pain Inventory Average Pain 8 Pain Right Now 8 My pain is sharp and aching  In the last 24 hours, has pain interfered with the following? General activity 9 Relation with others 9 Enjoyment of life 9 What TIME of day is your pain at its worst? daytime Sleep (in general) Fair  Pain is worse with: walking and sitting Pain improves with: medication Relief from Meds: 5  Mobility walk with assistance use a cane how many minutes can you walk? 5 ability to climb steps?  no do you drive?  no  Function Do you have any goals in this area?  no  Neuro/Psych No problems in this area  Prior Studies Any changes since last visit?  no  Physicians involved in your care Any changes since last visit?  no   Family History  Problem Relation Age of Onset  . COPD Mother   . Heart disease Father   . Diabetes Sister   . COPD Brother   . Diabetes Sister   . Diabetes Brother    History   Social History  . Marital Status: Divorced    Spouse Name: N/A    Number of Children: N/A  . Years of Education: N/A   Social History Main Topics  . Smoking status: Current Every Day Smoker -- 0.5 packs/day for 45 years    Types: Cigarettes  . Smokeless tobacco: Never Used  . Alcohol Use: No  . Drug Use: No  . Sexually Active: None   Other Topics Concern  . None   Social History Narrative  . None   Past Surgical History  Procedure Date  . Appendectomy   . Abdominal hysterectomy   . Stents 2007    5 cardiac stents    Past Medical History  Diagnosis Date  . CAD (coronary artery disease)   . Acute MI 2006  . Hypercholesterolemia   . Rheumatoid arthritis   . Tobacco abuse   . COPD (chronic obstructive pulmonary disease)   . Bronchitis, chronic   . Diabetes mellitus   . Atrial fibrillation   . Osteoporosis   . Multiple URI    BP 150/80  Pulse 100  Resp 18  Ht 4\' 11"  (1.499 m)  Wt 141 lb (63.957 kg)  BMI 28.48 kg/m2  SpO2 100%   Review of Systems  Respiratory: Positive for apnea.   Musculoskeletal: Positive for back pain and gait problem.  All other systems reviewed and are negative.       Objective:   Physical Exam Constitutional: She is oriented to person, place, and time. She appears well-developed.  HENT:  Head: Normocephalic.  Eyes: Pupils are equal, round, and reactive to light.  Neck: Neck supple.  Neurological: She is alert and oriented to person, place, and time.  Skin: Skin is warm and dry.  Psychiatric: She has a normal mood and affect.  Symmetric normal motor tone is noted throughout. Normal muscle bulk. Muscle testing reveals 5/5 muscle strength  of the upper extremity, and 5/5 of the lower extremity. Full range of motion in upper and lower extremities, except right wrist . ROM of spine is Restricted, kyphotic. Deformity of right wrist.  DTR in the upper and lower extremity are present and symmetric 2+. No clonus is noted.  Patient arises from chair with difficulty. Wide based gait with a cane, thoracic kyphosis ++ .  Extreme tenderness to even light touch on her entire back.  Soft Mass/ cyst on right foot.        Assessment & Plan:  This is a 61 year old female with  1.LBP , some increased LBP, with radiating burning pain into her right posterior hip, increased her Lyrica to 75mg  bid.  2.Peripheral neuropathy  3.RA  4.DM  Plan :  Continue with medications, advised patient to cut down further on her smoking, she states, that she is down to half a pack per day  now. Patient has not seen her PCP to evaluate the mass on her foot yet . Strongly advised her to follow up asap with her PCP ! Refilled her oxycodone . Advised patient to do some exercises and stretches in her hot tub, if she can safely get in and out of it. Also she should stay as active as possible and walk as much as tolerated.  Follow up in 1 month.

## 2012-07-09 ENCOUNTER — Other Ambulatory Visit: Payer: Self-pay | Admitting: *Deleted

## 2012-07-09 MED ORDER — PREGABALIN 25 MG PO CAPS: 75.0000 mg | ORAL_CAPSULE | Freq: Two times a day (BID) | ORAL | Status: DC

## 2012-07-26 ENCOUNTER — Encounter: Payer: Self-pay | Admitting: Physical Medicine and Rehabilitation

## 2012-07-26 ENCOUNTER — Encounter
Payer: Medicaid Other | Attending: Physical Medicine and Rehabilitation | Admitting: Physical Medicine and Rehabilitation

## 2012-07-26 VITALS — BP 110/68 | HR 91 | Resp 14 | Ht <= 58 in | Wt 137.0 lb

## 2012-07-26 DIAGNOSIS — E1149 Type 2 diabetes mellitus with other diabetic neurological complication: Secondary | ICD-10-CM

## 2012-07-26 DIAGNOSIS — E1142 Type 2 diabetes mellitus with diabetic polyneuropathy: Secondary | ICD-10-CM

## 2012-07-26 DIAGNOSIS — M549 Dorsalgia, unspecified: Secondary | ICD-10-CM

## 2012-07-26 DIAGNOSIS — M069 Rheumatoid arthritis, unspecified: Secondary | ICD-10-CM | POA: Insufficient documentation

## 2012-07-26 DIAGNOSIS — M545 Low back pain, unspecified: Secondary | ICD-10-CM | POA: Insufficient documentation

## 2012-07-26 DIAGNOSIS — G609 Hereditary and idiopathic neuropathy, unspecified: Secondary | ICD-10-CM | POA: Insufficient documentation

## 2012-07-26 DIAGNOSIS — G8929 Other chronic pain: Secondary | ICD-10-CM | POA: Insufficient documentation

## 2012-07-26 DIAGNOSIS — E1140 Type 2 diabetes mellitus with diabetic neuropathy, unspecified: Secondary | ICD-10-CM

## 2012-07-26 DIAGNOSIS — E119 Type 2 diabetes mellitus without complications: Secondary | ICD-10-CM | POA: Insufficient documentation

## 2012-07-26 DIAGNOSIS — Z5181 Encounter for therapeutic drug level monitoring: Secondary | ICD-10-CM

## 2012-07-26 MED ORDER — OXYCODONE-ACETAMINOPHEN 10-325 MG PO TABS: 1.0000 | ORAL_TABLET | ORAL | Status: DC | PRN

## 2012-07-26 NOTE — Progress Notes (Signed)
Subjective:    Patient ID: Beth [name], female    DOB: October 02, 1950, 62 y.o.   MRN: [id number]  HPI The patient complains about chronic back pain . The patient denies any radiation into her LEs. The patient also complains about numbness and tingling in her feet.  The problem has been stable. The patient has not followed up with her PCP to evaluate the mass/cyst ? on her right foot, yet. Although I have been telling her to follow up on this for several month now.   Pain Inventory Average Pain 8 Pain Right Now 8 My pain is aching  In the last 24 hours, has pain interfered with the following? General activity 8 Relation with others 8 Enjoyment of life 8 What TIME of day is your pain at its worst? all of the time Sleep (in general) Poor  Pain is worse with: walking, sitting and standing Pain improves with: medication Relief from Meds: 5  Mobility use a cane ability to climb steps?  no do you drive?  no  Function I need assistance with the following:  meal prep, household duties and shopping  Neuro/Psych trouble walking  Prior Studies Any changes since last visit?  no  Physicians involved in your care Any changes since last visit?  no   Family History  Problem Relation Age of Onset  . COPD Mother   . Heart disease Father   . Diabetes Sister   . COPD Brother   . Diabetes Sister   . Diabetes Brother    History   Social History  . Marital Status: Divorced    Spouse Name: N/A    Number of Children: N/A  . Years of Education: N/A   Social History Main Topics  . Smoking status: Current Every Day Smoker -- 0.5 packs/day for 45 years    Types: Cigarettes  . Smokeless tobacco: Never Used  . Alcohol Use: No  . Drug Use: No  . Sexually Active: None   Other Topics Concern  . None   Social History Narrative  . None   Past Surgical History  Procedure Date  . Appendectomy   . Abdominal hysterectomy   . Stents 2007    5 cardiac stents   Past Medical  History  Diagnosis Date  . CAD (coronary artery disease)   . Acute MI 2006  . Hypercholesterolemia   . Rheumatoid arthritis   . Tobacco abuse   . COPD (chronic obstructive pulmonary disease)   . Bronchitis, chronic   . Diabetes mellitus   . Atrial fibrillation   . Osteoporosis   . Multiple URI    BP 110/68  Pulse 91  Resp 14  Ht 4\' 10"  (1.473 m)  Wt 137 lb (62.143 kg)  BMI 28.63 kg/m2  SpO2 96%   Review of Systems  Constitutional: Positive for appetite change.       Poor appetite  Gastrointestinal: Positive for nausea.  Musculoskeletal: Positive for back pain, joint swelling, arthralgias and gait problem.       Hip pain  All other systems reviewed and are negative.       Objective:   Physical Exam Constitutional: She is oriented to person, place, and time. She appears well-developed.  HENT:  Head: Normocephalic.  Eyes: Pupils are equal, round, and reactive to light.  Neck: Neck supple.  Neurological: She is alert and oriented to person, place, and time.  Skin: Skin is warm and dry.  Psychiatric: She has a normal mood and affect.  Symmetric normal motor tone is noted throughout. Normal muscle bulk. Muscle testing reveals 5/5 muscle strength of the upper extremity, and 5/5 of the lower extremity. Full range of motion in upper and lower extremities, except right wrist . ROM of spine is Restricted, kyphotic. Deformity of right wrist.  DTR in the upper and lower extremity are present and symmetric 2+. No clonus is noted.  Patient arises from chair with difficulty. Wide based gait with a cane, thoracic kyphosis ++ .  Extreme tenderness to even light touch on her entire back.  Soft Mass/ cyst on right foot.        Assessment & Plan:  This is a 62 year old female with  1.LBP , some increased LBP, with radiating burning pain into her right posterior hip, increased her Lyrica to 75mg  bid.  2.Peripheral neuropathy  3.RA  4.DM  Plan :  Continue with medications,  advised patient to cut down further on her smoking, she states, that she is down to half a pack per day now. Patient has not seen her PCP to evaluate the mass on her foot yet . Strongly advised her to follow up asap with her PCP ! Refilled her oxycodone . Advised patient to do some exercises and stretches in her hot tub, if she can safely get in and out of it. Also she should stay as active as possible and walk as much as tolerated.  Follow up in 1 month.

## 2012-07-26 NOTE — Patient Instructions (Signed)
Follow up with your PCP for the mass on your left foot.

## 2012-08-24 ENCOUNTER — Encounter
Payer: Medicaid Other | Attending: Physical Medicine and Rehabilitation | Admitting: Physical Medicine and Rehabilitation

## 2012-08-24 ENCOUNTER — Encounter: Payer: Self-pay | Admitting: Physical Medicine and Rehabilitation

## 2012-08-24 VITALS — BP 125/81 | HR 82 | Ht 59.0 in | Wt 137.0 lb

## 2012-08-24 DIAGNOSIS — G8929 Other chronic pain: Secondary | ICD-10-CM

## 2012-08-24 DIAGNOSIS — G609 Hereditary and idiopathic neuropathy, unspecified: Secondary | ICD-10-CM | POA: Insufficient documentation

## 2012-08-24 DIAGNOSIS — M069 Rheumatoid arthritis, unspecified: Secondary | ICD-10-CM

## 2012-08-24 DIAGNOSIS — M545 Low back pain, unspecified: Secondary | ICD-10-CM | POA: Insufficient documentation

## 2012-08-24 DIAGNOSIS — E119 Type 2 diabetes mellitus without complications: Secondary | ICD-10-CM | POA: Insufficient documentation

## 2012-08-24 DIAGNOSIS — M25559 Pain in unspecified hip: Secondary | ICD-10-CM | POA: Insufficient documentation

## 2012-08-24 MED ORDER — OXYCODONE-ACETAMINOPHEN 10-325 MG PO TABS: 1.0000 | ORAL_TABLET | ORAL | Status: DC | PRN

## 2012-08-24 NOTE — Progress Notes (Signed)
Subjective:    Patient ID: Beth [name], female    DOB: [birth year], 62 y.o.   MRN: [id number]  HPI The patient complains about chronic back pain . The patient denies any radiation into her LEs. The patient also complains about numbness and tingling in her feet.  The problem has been stable. The patient states, that she has a follow up appointment with her PCP to evaluate the mass/cyst ? on her right foot, this month.   Pain Inventory Average Pain 8 Pain Right Now 8 My pain is aching  In the last 24 hours, has pain interfered with the following? General activity 8 Relation with others 8 Enjoyment of life 8 What TIME of day is your pain at its worst? Morning and Night Sleep (in general) Fair  Pain is worse with: standing Pain improves with: medication Relief from Meds: 8  Mobility use a cane ability to climb steps?  no do you drive?  no  Function retired  Neuro/Psych No problems in this area  Prior Studies Any changes since last visit?  no  Physicians involved in your care Any changes since last visit?  no   Family History  Problem Relation Age of Onset  . COPD Mother   . Heart disease Father   . Diabetes Sister   . COPD Brother   . Diabetes Sister   . Diabetes Brother    History   Social History  . Marital Status: Divorced    Spouse Name: N/A    Number of Children: N/A  . Years of Education: N/A   Social History Main Topics  . Smoking status: Current Every Day Smoker -- 0.5 packs/day for 45 years    Types: Cigarettes  . Smokeless tobacco: Never Used  . Alcohol Use: No  . Drug Use: No  . Sexually Active: None   Other Topics Concern  . None   Social History Narrative  . None   Past Surgical History  Procedure Date  . Appendectomy   . Abdominal hysterectomy   . Stents 2007    5 cardiac stents   Past Medical History  Diagnosis Date  . CAD (coronary artery disease)   . Acute MI 2006  . Hypercholesterolemia   . Rheumatoid arthritis    . Tobacco abuse   . COPD (chronic obstructive pulmonary disease)   . Bronchitis, chronic   . Diabetes mellitus   . Atrial fibrillation   . Osteoporosis   . Multiple URI    BP 125/81  Pulse 82  Ht 4\' 11"  (1.499 m)  Wt 137 lb (62.143 kg)  BMI 27.67 kg/m2  SpO2 97%      Review of Systems  Constitutional: Negative for fever and unexpected weight change.  HENT: Negative for ear pain, nosebleeds, congestion, sore throat, rhinorrhea, sneezing, trouble swallowing, dental problem, postnasal drip and sinus pressure.   Eyes: Negative for redness and itching.  Respiratory: Negative for cough, chest tightness, shortness of breath and wheezing.   Cardiovascular: Negative for palpitations and leg swelling.  Gastrointestinal: Negative for nausea and vomiting.  Genitourinary: Negative for dysuria.  Musculoskeletal: Negative for joint swelling.  Skin: Negative for rash.  Neurological: Negative for headaches.  Hematological: Does not bruise/bleed easily.  Psychiatric/Behavioral: Negative for dysphoric mood. The patient is not nervous/anxious.        Objective:   Physical Exam Constitutional: She is oriented to person, place, and time. She appears well-developed.  HENT:  Head: Normocephalic.  Eyes: Pupils are equal, round, and  reactive to light.  Neck: Neck supple.  Neurological: She is alert and oriented to person, place, and time.  Skin: Skin is warm and dry.  Psychiatric: She has a normal mood and affect.  Symmetric normal motor tone is noted throughout. Normal muscle bulk. Muscle testing reveals 5/5 muscle strength of the upper extremity, and 5/5 of the lower extremity. Full range of motion in upper and lower extremities, except right wrist . ROM of spine is Restricted, kyphotic. Deformity of right wrist.  DTR in the upper and lower extremity are present and symmetric 2+. No clonus is noted.  Patient arises from chair with difficulty. Wide based gait with a cane, thoracic kyphosis ++  .  Extreme tenderness to even light touch on her entire back.  Soft Mass/ cyst on right foot.        Assessment & Plan:  This is a 62 year old female with  1.LBP , some increased LBP, with radiating burning pain into her right posterior hip, increased her Lyrica to 75mg  bid.  2.Peripheral neuropathy  3.RA  4.DM  Plan :  Continue with medications, advised patient to cut down further on her smoking, she states, that she is down to half a pack per day now. Patient has not seen her PCP to evaluate the mass on her foot yet . Strongly advised her to follow up asap with her PCP !,She states, that she has an appointment around the 20th this month. Refilled her oxycodone . Advised patient to do some exercises and stretches in her hot tub, if she can safely get in and out of it. Also she should stay as active as possible and walk as much as tolerated.  Follow up in 1 month.

## 2012-08-24 NOTE — Patient Instructions (Signed)
Continue with stretching in your hot tub.

## 2012-09-12 ENCOUNTER — Other Ambulatory Visit: Payer: Self-pay | Admitting: *Deleted

## 2012-09-12 MED ORDER — PREGABALIN 25 MG PO CAPS: 75.0000 mg | ORAL_CAPSULE | Freq: Two times a day (BID) | ORAL | Status: DC

## 2012-09-21 ENCOUNTER — Ambulatory Visit: Payer: Medicaid Other | Admitting: Family Medicine

## 2012-09-21 ENCOUNTER — Telehealth: Payer: Self-pay | Admitting: Physical Medicine and Rehabilitation

## 2012-09-21 ENCOUNTER — Encounter
Payer: Medicaid Other | Attending: Physical Medicine and Rehabilitation | Admitting: Physical Medicine and Rehabilitation
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
## 2012-09-21 NOTE — Telephone Encounter (Signed)
Called in prescription of vicodin 10/325 #20 to [facility] [address] per her request. [phone]
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
## 2012-09-24 ENCOUNTER — Telehealth: Payer: Self-pay | Admitting: Physical Medicine and Rehabilitation

## 2012-09-24 ENCOUNTER — Other Ambulatory Visit: Payer: Self-pay

## 2012-09-24 MED ORDER — OXYCODONE-ACETAMINOPHEN 10-325 MG PO TABS: 1.0000 | ORAL_TABLET | ORAL | Status: DC | PRN

## 2012-09-24 NOTE — Telephone Encounter (Signed)
Patient called needing a refill on her Percocet 325 10mg .  Please call patient when it is ready [phone] or [phone]

## 2012-09-24 NOTE — Telephone Encounter (Signed)
Percocet ready for pick up family aware.
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
## 2012-10-03 ENCOUNTER — Encounter: Payer: Self-pay | Admitting: *Deleted

## 2012-10-05 ENCOUNTER — Ambulatory Visit (INDEPENDENT_AMBULATORY_CARE_PROVIDER_SITE_OTHER): Payer: Medicaid Other | Admitting: Family Medicine

## 2012-10-05 ENCOUNTER — Encounter: Payer: Self-pay | Admitting: Family Medicine

## 2012-10-05 ENCOUNTER — Telehealth: Payer: Self-pay

## 2012-10-05 VITALS — BP 111/90 | HR 88 | Temp 99.0°F | Ht 59.0 in | Wt 137.7 lb

## 2012-10-05 DIAGNOSIS — R229 Localized swelling, mass and lump, unspecified: Secondary | ICD-10-CM

## 2012-10-05 DIAGNOSIS — R2240 Localized swelling, mass and lump, unspecified lower limb: Secondary | ICD-10-CM | POA: Insufficient documentation

## 2012-10-05 DIAGNOSIS — IMO0001 Reserved for inherently not codable concepts without codable children: Secondary | ICD-10-CM

## 2012-10-05 DIAGNOSIS — R2241 Localized swelling, mass and lump, right lower limb: Secondary | ICD-10-CM

## 2012-10-05 DIAGNOSIS — E1165 Type 2 diabetes mellitus with hyperglycemia: Secondary | ICD-10-CM

## 2012-10-05 LAB — BASIC METABOLIC PANEL
Calcium: 9.3 mg/dL (ref 8.4–10.5)
Creat: 0.78 mg/dL (ref 0.50–1.10)

## 2012-10-05 LAB — POCT URINALYSIS DIPSTICK
Nitrite, UA: NEGATIVE
Spec Grav, UA: 1.025
Urobilinogen, UA: 0.2
pH, UA: 5.5

## 2012-10-05 LAB — POCT UA - MICROSCOPIC ONLY: Casts, Ur, LPF, POC: >20

## 2012-10-05 LAB — CBC
Hemoglobin: 17 g/dL — ABNORMAL HIGH (ref 12.0–15.0)
Platelets: 245 10*3/uL (ref 150–400)
RBC: 5.14 MIL/uL — ABNORMAL HIGH (ref 3.87–5.11)
WBC: 4.9 10*3/uL (ref 4.0–10.5)

## 2012-10-05 LAB — POCT GLYCOSYLATED HEMOGLOBIN (HGB A1C): Hemoglobin A1C: 12.2

## 2012-10-05 MED ORDER — INSULIN GLARGINE 100 UNIT/ML ~~LOC~~ SOLN: 20.0000 [IU] | Freq: Every day | SUBCUTANEOUS | Status: DC

## 2012-10-05 NOTE — Patient Instructions (Signed)
Diabetes medication instructions: Start Lantus 20 units subcutaneously DAILY for the first week. Then, after the first week, check your fasting blood glucose in the morning. Increase Lantus by ONE unity daily each time your fasting blood glucose is above 250. Your goal is a fasting blood glucose < 150, then you can stop increasing Lantus. Schedule appointment with Dr. Raymondo Band in [facility] in ONE week.  Diet Recommendations for Diabetes   Starchy (carb) foods include: Bread, rice, pasta, potatoes, corn, crackers, bagels, muffins, all baked goods.   Protein foods include: Meat, fish, poultry, eggs, dairy foods, and beans such as pinto and kidney beans (beans also provide carbohydrate).   1. Eat at least 3 meals and 1-2 snacks per day. Never go more than 4-5 hours while awake without eating.  2. Limit starchy foods to TWO per meal and ONE per snack. ONE portion of a starchy  food is equal to the following:   - ONE slice of bread (or its equivalent, such as half of a hamburger bun).   - 1/2 cup of a "scoopable" starchy food such as potatoes or rice.   - 1 OUNCE (28 grams) of starchy snack foods such as crackers or pretzels (look on label).   - 15 grams of carbohydrate as shown on food label.  3. Both lunch and dinner should include a protein food, a carb food, and vegetables.   - Obtain twice as many veg's as protein or carbohydrate foods for both lunch and dinner.   - Try to keep frozen veg's on hand for a quick vegetable serving.     - Fresh or frozen veg's are best.  4. Breakfast should always include protein.

## 2012-10-05 NOTE — Assessment & Plan Note (Signed)
Hemoglobin A1c 12.2 today.  She has been out of medication for over one year due to financial issues.  No signs of infection at this time.  Plan to restart Lantus 20 unit daily for one week.  Then, she is to increase Lantus by 1 unit daily for CBG >250.  Once fasting CBG 150, she should maintain that Lantus dose.  Will refer her to Dr. Macky Lower clinic in 1-2 weeks for co-management.  Discussed diabetic diet, hand out given.  Red flags reviewed per AVS.

## 2012-10-05 NOTE — Progress Notes (Signed)
  Subjective:     Beth [name] is a 62 y.o. female who presents for follow up of diabetes.  She was diagnosed with DM about 12 years ago when she was hospitalized for acute MI.  She has been out of medications: both insulin and Metformin for about one year due to financial issues.  Current symptoms include: hyperglycemia, paresthesia of the feet and polydipsia. Patient denies foot ulcerations, nausea and vomiting. Evaluation to date has been: hemoglobin A1C. Home sugars: range between 80 and 350. Current treatments: none. Last dilated eye exam: 1.5 year ago.  She also needs a referral to surgeon for a mass/knot on foot.  Started about one year ago, has been getting bigger.  Only painful when her shoe rubs on foot.   She denies any redness or swelling.   Denies any open wound or pus drainage.  Denies any associated fevers.     Review of Systems Per HPI   Objective:    BP 111/90  Pulse 88  Temp(Src) 99 F (37.2 C) (Oral)  Ht 4\' 11"  (1.499 m)  Wt 137 lb 11.2 oz (62.46 kg)  BMI 27.8 kg/m2 General appearance: alert, cooperative and no distress Extremities: Golf ball size cyst located on dorsal aspect of RT foot anterior to ankle joint; mild tenderness on palpation, but no evidence of cellulitis infection; diminished distal pulse on RT. Skin: skin is very dry especially RT LE and foot; normal sensation, no open wounds     Face to face time spent counseling: 25 minutes.   Assessment:   Diabetes 2 out of control.  Cyst on FT foot dorsal aspect.   Plan:

## 2012-10-05 NOTE — Telephone Encounter (Signed)
Patient called saying she is having back pain.  She was wanting a muscle relaxer.  Please advise.

## 2012-10-05 NOTE — Addendum Note (Signed)
Addended by: [place], Adamarys Shall on: 10/05/2012 12:51 PM   Modules accepted: Orders

## 2012-10-05 NOTE — Telephone Encounter (Signed)
She can get Robaxin 500mg  bid, if not better should make appointment

## 2012-10-05 NOTE — Assessment & Plan Note (Signed)
Does not appear infected at this time.  Will refer to Ortho for surgical management.

## 2012-10-08 MED ORDER — METHOCARBAMOL 500 MG PO TABS: 500.0000 mg | ORAL_TABLET | Freq: Four times a day (QID) | ORAL | Status: DC

## 2012-10-08 NOTE — Telephone Encounter (Signed)
Robaxin sent to pharmacy.  Tried to call patient and her daughter but neither number were in working order.

## 2012-10-15 ENCOUNTER — Ambulatory Visit: Payer: Medicaid Other | Admitting: Pharmacist

## 2012-10-19 ENCOUNTER — Encounter: Payer: Self-pay | Admitting: Physical Medicine and Rehabilitation

## 2012-10-19 ENCOUNTER — Encounter
Payer: Medicaid Other | Attending: Physical Medicine and Rehabilitation | Admitting: Physical Medicine and Rehabilitation

## 2012-10-19 VITALS — BP 110/70 | HR 83 | Resp 14 | Ht 59.0 in | Wt 139.0 lb

## 2012-10-19 DIAGNOSIS — G894 Chronic pain syndrome: Secondary | ICD-10-CM | POA: Insufficient documentation

## 2012-10-19 DIAGNOSIS — Z5181 Encounter for therapeutic drug level monitoring: Secondary | ICD-10-CM | POA: Insufficient documentation

## 2012-10-19 DIAGNOSIS — Z79899 Other long term (current) drug therapy: Secondary | ICD-10-CM | POA: Insufficient documentation

## 2012-10-19 DIAGNOSIS — M069 Rheumatoid arthritis, unspecified: Secondary | ICD-10-CM | POA: Insufficient documentation

## 2012-10-19 MED ORDER — PROMETHAZINE HCL 25 MG PO TABS: 25.0000 mg | ORAL_TABLET | ORAL | Status: DC

## 2012-10-19 MED ORDER — OXYCODONE-ACETAMINOPHEN 10-325 MG PO TABS: 1.0000 | ORAL_TABLET | ORAL | Status: DC | PRN

## 2012-10-19 MED ORDER — PREGABALIN 25 MG PO CAPS: 75.0000 mg | ORAL_CAPSULE | Freq: Two times a day (BID) | ORAL | Status: DC

## 2012-10-19 NOTE — Patient Instructions (Signed)
Try to stay as active as tolerated 

## 2012-10-19 NOTE — Progress Notes (Signed)
Subjective:    Patient ID: Beth [name], female    DOB: [birth year], 62 y.o.   MRN: [id number]  HPI The patient complains about chronic back pain . The patient denies any radiation into her LEs. The patient also complains about numbness and tingling in her feet.  The problem has been stable. The patient states, that she has followed up with her PCP to evaluate the mass/cyst ? on her right foot, he tried to drain it, but could not get anything out. He ordered a MRI, which is scheduled for next Monday.    Pain Inventory Average Pain 8 Pain Right Now 8 My pain is constant  In the last 24 hours, has pain interfered with the following? General activity 8 Relation with others 8 Enjoyment of life 8 What TIME of day is your pain at its worst? varies Sleep (in general) Fair  Pain is worse with: walking Pain improves with: pacing activities and medication Relief from Meds: 7  Mobility use a cane Do you have any goals in this area?  no  Function Do you have any goals in this area?  no  Neuro/Psych trouble walking  Prior Studies Any changes since last visit?  no  Physicians involved in your care Any changes since last visit?  no   Family History  Problem Relation Age of Onset  . COPD Mother   . Heart disease Father   . Diabetes Sister   . COPD Brother   . Diabetes Sister   . Diabetes Brother    History   Social History  . Marital Status: Divorced    Spouse Name: N/A    Number of Children: N/A  . Years of Education: N/A   Social History Main Topics  . Smoking status: Current Every Day Smoker -- 0.50 packs/day for 45 years    Types: Cigarettes  . Smokeless tobacco: Never Used  . Alcohol Use: No  . Drug Use: No  . Sexually Active: None   Other Topics Concern  . None   Social History Narrative  . None   Past Surgical History  Procedure Laterality Date  . Appendectomy    . Abdominal hysterectomy    . Stents  2007    5 cardiac stents   Past Medical  History  Diagnosis Date  . CAD (coronary artery disease)   . Acute MI 2006  . Hypercholesterolemia   . Rheumatoid arthritis   . Tobacco abuse   . COPD (chronic obstructive pulmonary disease)   . Bronchitis, chronic   . Diabetes mellitus   . Atrial fibrillation   . Osteoporosis   . Multiple URI    BP 110/70  Pulse 83  Resp 14  Ht 4\' 11"  (1.499 m)  Wt 139 lb (63.05 kg)  BMI 28.06 kg/m2  SpO2 98%     Review of Systems  Gastrointestinal: Positive for nausea.  Musculoskeletal: Positive for back pain and gait problem.  All other systems reviewed and are negative.       Objective:   Physical Exam Constitutional: She is oriented to person, place, and time. She appears well-developed.  HENT:  Head: Normocephalic.  Eyes: Pupils are equal, round, and reactive to light.  Neck: Neck supple.  Neurological: She is alert and oriented to person, place, and time.  Skin: Skin is warm and dry.  Psychiatric: She has a normal mood and affect.  Symmetric normal motor tone is noted throughout. Normal muscle bulk. Muscle testing reveals 5/5 muscle strength of  the upper extremity, and 5/5 of the lower extremity. Full range of motion in upper and lower extremities, except right wrist . ROM of spine is Restricted, kyphotic. Deformity of right wrist.  DTR in the upper and lower extremity are present and symmetric 2+. No clonus is noted.  Patient arises from chair with difficulty. Wide based gait with a cane, thoracic kyphosis ++ .  Extreme tenderness to even light touch on her entire back.  Soft Mass/ cyst on right foot.        Assessment & Plan:  This is a 62 year old female with  1.LBP , some increased LBP, with radiating burning pain into her right posterior hip, increased her Lyrica to 75mg  bid.  2.Peripheral neuropathy  3.RA  4.DM  Plan :  Continue with medications, advised patient to cut down further on her smoking, she states, that she is down to half a pack per day now.The  patient states, that she has followed up with her PCP to evaluate the mass/cyst ? on her right foot, he tried to drain it, but could not get anything out. He ordered a MRI, which is scheduled for next Monday. Patient did not bring her pill bottle, I educated her that she always has to bring her medication to the visit, as it is stated in her contract. [place], so I told her that she does not have to drive back today to get her meds, but if she does not bring her meds to her visits, we will not fill her medication.  Refilled her oxycodone, after she gave [place] a UDS . Advised patient to do some exercises and stretches in her hot tub, if she can safely get in and out of it. Also she should stay as active as possible and walk as much as tolerated.  Follow up in 1 month.

## 2012-10-22 ENCOUNTER — Telehealth: Payer: Self-pay

## 2012-10-22 MED ORDER — PROMETHAZINE HCL 25 MG PO TABS: 25.0000 mg | ORAL_TABLET | Freq: Every day | ORAL | Status: DC | PRN

## 2012-10-22 NOTE — Telephone Encounter (Signed)
New order for promethazine placed and sent to pharmacy.

## 2012-10-22 NOTE — Telephone Encounter (Signed)
[facility] pharmacy called with questions regarding patients promethazine prescription and the directions.  Please clarify.  Is she only taking it once or daily or what?

## 2012-10-22 NOTE — Telephone Encounter (Signed)
1 tablet per day as needed for nausea, # 30 per month

## 2012-10-31 ENCOUNTER — Telehealth: Payer: Self-pay | Admitting: Physical Medicine and Rehabilitation

## 2012-10-31 NOTE — Telephone Encounter (Signed)
Patient is going in on 11/09/12 to have surgery on her right foot.  Wanted to let Dr. Pamelia Hoit know about this just in case she will need any medication.

## 2012-10-31 NOTE — Telephone Encounter (Signed)
She should let her surgeon know what she is already on.

## 2012-11-01 ENCOUNTER — Other Ambulatory Visit (HOSPITAL_COMMUNITY): Payer: Self-pay | Admitting: Orthopaedic Surgery

## 2012-11-02 ENCOUNTER — Encounter (HOSPITAL_COMMUNITY): Payer: Self-pay | Admitting: Pharmacy Technician

## 2012-11-07 ENCOUNTER — Encounter (HOSPITAL_COMMUNITY): Payer: Self-pay

## 2012-11-07 ENCOUNTER — Encounter (HOSPITAL_COMMUNITY)
Admission: RE | Admit: 2012-11-07 | Discharge: 2012-11-07 | Disposition: A | Discharge status: Home / Self Care | Payer: Medicaid Other | Source: Ambulatory Visit | Attending: Orthopaedic Surgery | Admitting: Orthopaedic Surgery

## 2012-11-07 ENCOUNTER — Ambulatory Visit (HOSPITAL_COMMUNITY)
Admission: RE | Admit: 2012-11-07 | Discharge: 2012-11-07 | Disposition: A | Discharge status: Home / Self Care | Payer: Medicaid Other | Source: Ambulatory Visit | Attending: Orthopaedic Surgery | Admitting: Orthopaedic Surgery

## 2012-11-07 DIAGNOSIS — R0602 Shortness of breath: Secondary | ICD-10-CM | POA: Insufficient documentation

## 2012-11-07 DIAGNOSIS — Z95818 Presence of other cardiac implants and grafts: Secondary | ICD-10-CM | POA: Insufficient documentation

## 2012-11-07 DIAGNOSIS — Z01810 Encounter for preprocedural cardiovascular examination: Secondary | ICD-10-CM | POA: Insufficient documentation

## 2012-11-07 DIAGNOSIS — Z01812 Encounter for preprocedural laboratory examination: Secondary | ICD-10-CM | POA: Insufficient documentation

## 2012-11-07 DIAGNOSIS — I10 Essential (primary) hypertension: Secondary | ICD-10-CM | POA: Insufficient documentation

## 2012-11-07 DIAGNOSIS — E119 Type 2 diabetes mellitus without complications: Secondary | ICD-10-CM | POA: Insufficient documentation

## 2012-11-07 DIAGNOSIS — R9431 Abnormal electrocardiogram [ECG] [EKG]: Secondary | ICD-10-CM | POA: Insufficient documentation

## 2012-11-07 DIAGNOSIS — Z01818 Encounter for other preprocedural examination: Secondary | ICD-10-CM | POA: Insufficient documentation

## 2012-11-07 HISTORY — DX: Sleep apnea, unspecified: G47.30

## 2012-11-07 HISTORY — DX: Pneumonia, unspecified organism: J18.9

## 2012-11-07 HISTORY — DX: Other complications of anesthesia, initial encounter: T88.59XA

## 2012-11-07 HISTORY — DX: Anemia, unspecified: D64.9

## 2012-11-07 HISTORY — DX: Adverse effect of unspecified anesthetic, initial encounter: T41.45XA

## 2012-11-07 HISTORY — DX: Inflammatory liver disease, unspecified: K75.9

## 2012-11-07 LAB — COMPREHENSIVE METABOLIC PANEL
ALT: 11 U/L (ref 0–35)
AST: 17 U/L (ref 0–37)
Alkaline Phosphatase: 87 U/L (ref 39–117)
CO2: 28 mEq/L (ref 19–32)
Calcium: 9.7 mg/dL (ref 8.4–10.5)
Chloride: 97 mEq/L (ref 96–112)
GFR calc Af Amer: >90 mL/min (ref >90)
GFR calc non Af Amer: >90 mL/min (ref >90)
Glucose, Bld: 353 mg/dL — ABNORMAL HIGH (ref 70–99)
Potassium: 4.6 mEq/L (ref 3.5–5.1)
Sodium: 133 mEq/L — ABNORMAL LOW (ref 135–145)

## 2012-11-07 LAB — CBC
Hemoglobin: 16.9 g/dL — ABNORMAL HIGH (ref 12.0–15.0)
MCH: 33.1 pg (ref 26.0–34.0)
Platelets: 133 10*3/uL — ABNORMAL LOW (ref 150–400)
RBC: 5.11 MIL/uL (ref 3.87–5.11)
WBC: 5.5 10*3/uL (ref 4.0–10.5)

## 2012-11-07 LAB — SURGICAL PCR SCREEN
MRSA, PCR: NEGATIVE
Staphylococcus aureus: NEGATIVE

## 2012-11-07 NOTE — Progress Notes (Signed)
Last office visit with Dr Ivette Loyal in [facility] dated 10/19/12.

## 2012-11-07 NOTE — Progress Notes (Signed)
Patient has not been on diabetic medications for at least one year per office visit note of PCP in [facility] dated 10/05/12- Dr Sondra Come.  Patient stated at preop appointment she will start Lantus Insulin after surgery.  Also last information fron cardiologist - Dr Allyson Sabal with [facility] is dated 10/02/2008.  Just an FYI.

## 2012-11-07 NOTE — Progress Notes (Signed)
Patient stated at time of preop appointment she is to start Lantus Insulin after surgery 20 units daily per MD.

## 2012-11-07 NOTE — Progress Notes (Signed)
Last office visit note with PCP in [facility] dated 10/05/12 with Dr Sondra Come.

## 2012-11-07 NOTE — Patient Instructions (Signed)
Beth [name]  11/07/2012   Your procedure is scheduled on:  11/09/12   Report to [facility] at    1130  AM.  Call this number if you have problems the morning of surgery: [phone]   Remember:             May have clear liquids until 0700am then npo.    Do not eat food after midnite.   Take these medicines the morning of surgery with A SIP OF WATER:    Do not wear jewelry, make-up or nail polish.  Do not wear lotions, powders, or perfumes.   Do not shave 48 hours prior to surgery.   Do not bring valuables to the hospital.  Contacts, dentures or bridgework may not be worn into surgery.  Leave suitcase in the car. After surgery it may be brought to your room.  For patients admitted to the hospital, checkout time is 11:00 AM the day of  discharge.      SEE CHG INSTRUCTION SHEET    Please read over the following fact sheets that you were given: MRSA Information, coughing and deep breathing exercises, leg exercises               Failure to comply with these instructions may result in cancellation of your surgery.                Patient Signature ____________________________              Nurse Signature _____________________________

## 2012-11-09 ENCOUNTER — Encounter (HOSPITAL_COMMUNITY)
Admission: RE | Disposition: A | Discharge status: Home / Self Care | Payer: Self-pay | Source: Ambulatory Visit | Attending: Orthopaedic Surgery

## 2012-11-09 ENCOUNTER — Ambulatory Visit (HOSPITAL_COMMUNITY): Payer: Medicaid Other | Admitting: Anesthesiology

## 2012-11-09 ENCOUNTER — Encounter (HOSPITAL_COMMUNITY): Payer: Self-pay | Admitting: Anesthesiology

## 2012-11-09 ENCOUNTER — Encounter (HOSPITAL_COMMUNITY): Payer: Self-pay | Admitting: *Deleted

## 2012-11-09 ENCOUNTER — Ambulatory Visit (HOSPITAL_COMMUNITY)
Admission: RE | Admit: 2012-11-09 | Discharge: 2012-11-09 | Disposition: A | Discharge status: Home / Self Care | Payer: Medicaid Other | Source: Ambulatory Visit | Attending: Orthopaedic Surgery | Admitting: Orthopaedic Surgery

## 2012-11-09 DIAGNOSIS — I252 Old myocardial infarction: Secondary | ICD-10-CM | POA: Insufficient documentation

## 2012-11-09 DIAGNOSIS — E7800 Pure hypercholesterolemia, unspecified: Secondary | ICD-10-CM | POA: Insufficient documentation

## 2012-11-09 DIAGNOSIS — Z885 Allergy status to narcotic agent status: Secondary | ICD-10-CM | POA: Insufficient documentation

## 2012-11-09 DIAGNOSIS — R2240 Localized swelling, mass and lump, unspecified lower limb: Secondary | ICD-10-CM

## 2012-11-09 DIAGNOSIS — Z883 Allergy status to other anti-infective agents status: Secondary | ICD-10-CM | POA: Insufficient documentation

## 2012-11-09 DIAGNOSIS — I2510 Atherosclerotic heart disease of native coronary artery without angina pectoris: Secondary | ICD-10-CM | POA: Insufficient documentation

## 2012-11-09 DIAGNOSIS — J449 Chronic obstructive pulmonary disease, unspecified: Secondary | ICD-10-CM | POA: Insufficient documentation

## 2012-11-09 DIAGNOSIS — M799 Soft tissue disorder, unspecified: Secondary | ICD-10-CM | POA: Insufficient documentation

## 2012-11-09 DIAGNOSIS — E119 Type 2 diabetes mellitus without complications: Secondary | ICD-10-CM | POA: Insufficient documentation

## 2012-11-09 DIAGNOSIS — G4730 Sleep apnea, unspecified: Secondary | ICD-10-CM | POA: Insufficient documentation

## 2012-11-09 DIAGNOSIS — F17200 Nicotine dependence, unspecified, uncomplicated: Secondary | ICD-10-CM | POA: Insufficient documentation

## 2012-11-09 DIAGNOSIS — M069 Rheumatoid arthritis, unspecified: Secondary | ICD-10-CM | POA: Insufficient documentation

## 2012-11-09 DIAGNOSIS — R2241 Localized swelling, mass and lump, right lower limb: Secondary | ICD-10-CM

## 2012-11-09 DIAGNOSIS — J4489 Other specified chronic obstructive pulmonary disease: Secondary | ICD-10-CM | POA: Insufficient documentation

## 2012-11-09 DIAGNOSIS — M810 Age-related osteoporosis without current pathological fracture: Secondary | ICD-10-CM | POA: Insufficient documentation

## 2012-11-09 DIAGNOSIS — I4891 Unspecified atrial fibrillation: Secondary | ICD-10-CM | POA: Insufficient documentation

## 2012-11-09 HISTORY — PX: MASS EXCISION: SHX2000

## 2012-11-09 HISTORY — PX: FOOT SURGERY: SHX648

## 2012-11-09 LAB — GLUCOSE, CAPILLARY
Glucose-Capillary: 175 mg/dL — ABNORMAL HIGH (ref 70–99)
Glucose-Capillary: 151 mg/dL — ABNORMAL HIGH (ref 70–99)

## 2012-11-09 SURGERY — EXCISION MASS
Anesthesia: General | Site: Foot | Laterality: Right | Wound class: Clean

## 2012-11-09 MED ORDER — FENTANYL CITRATE 0.05 MG/ML IJ SOLN
INTRAMUSCULAR | Status: AC
  Filled 2012-11-09: qty 2

## 2012-11-09 MED ORDER — BUPIVACAINE HCL (PF) 0.25 % IJ SOLN
INTRAMUSCULAR | Status: AC
  Filled 2012-11-09: qty 30

## 2012-11-09 MED ORDER — KETAMINE HCL 10 MG/ML IJ SOLN
INTRAMUSCULAR | Status: DC | PRN
  Administered 2012-11-09: 10 mg via INTRAVENOUS
  Administered 2012-11-09: 20 mg via INTRAVENOUS

## 2012-11-09 MED ORDER — CEFAZOLIN SODIUM-DEXTROSE 2-3 GM-% IV SOLR
INTRAVENOUS | Status: AC
  Filled 2012-11-09: qty 50

## 2012-11-09 MED ORDER — PROMETHAZINE HCL 25 MG/ML IJ SOLN: 6.2500 mg | INTRAMUSCULAR | Status: DC | PRN

## 2012-11-09 MED ORDER — FENTANYL CITRATE 0.05 MG/ML IJ SOLN
INTRAMUSCULAR | Status: DC | PRN
  Administered 2012-11-09: 25 ug via INTRAVENOUS

## 2012-11-09 MED ORDER — CEFAZOLIN SODIUM-DEXTROSE 2-3 GM-% IV SOLR
2.0000 g | INTRAVENOUS | Status: AC
  Administered 2012-11-09: 2 g via INTRAVENOUS

## 2012-11-09 MED ORDER — LACTATED RINGERS IV SOLN
INTRAVENOUS | Status: DC | PRN
  Administered 2012-11-09 (×2): via INTRAVENOUS

## 2012-11-09 MED ORDER — PROPOFOL 10 MG/ML IV BOLUS
INTRAVENOUS | Status: DC | PRN
  Administered 2012-11-09: 150 mg via INTRAVENOUS

## 2012-11-09 MED ORDER — OXYCODONE-ACETAMINOPHEN 5-325 MG PO TABS
ORAL_TABLET | ORAL | Status: AC
  Filled 2012-11-09: qty 2

## 2012-11-09 MED ORDER — INSULIN ASPART 100 UNIT/ML ~~LOC~~ SOLN
10.0000 [IU] | Freq: Once | SUBCUTANEOUS | Status: AC
  Administered 2012-11-09: 10 [IU] via SUBCUTANEOUS
  Filled 2012-11-09: qty 1

## 2012-11-09 MED ORDER — KETOROLAC TROMETHAMINE 30 MG/ML IJ SOLN
15.0000 mg | Freq: Once | INTRAMUSCULAR | Status: AC | PRN
  Administered 2012-11-09: 30 mg via INTRAVENOUS

## 2012-11-09 MED ORDER — LIDOCAINE HCL (CARDIAC) 20 MG/ML IV SOLN
INTRAVENOUS | Status: DC | PRN
  Administered 2012-11-09: 100 mg via INTRAVENOUS

## 2012-11-09 MED ORDER — INSULIN ASPART 100 UNIT/ML ~~LOC~~ SOLN
SUBCUTANEOUS | Status: AC
  Filled 2012-11-09: qty 1

## 2012-11-09 MED ORDER — KETOROLAC TROMETHAMINE 30 MG/ML IJ SOLN
INTRAMUSCULAR | Status: AC
  Administered 2012-11-09: 30 mg
  Filled 2012-11-09: qty 1

## 2012-11-09 MED ORDER — BUPIVACAINE HCL 0.25 % IJ SOLN
INTRAMUSCULAR | Status: DC | PRN
  Administered 2012-11-09: 10 mL

## 2012-11-09 MED ORDER — ACETAMINOPHEN 10 MG/ML IV SOLN
INTRAVENOUS | Status: DC | PRN
  Administered 2012-11-09: 1000 mg via INTRAVENOUS

## 2012-11-09 MED ORDER — OXYCODONE-ACETAMINOPHEN 5-325 MG PO TABS
2.0000 | ORAL_TABLET | Freq: Once | ORAL | Status: AC
  Administered 2012-11-09: 2 via ORAL

## 2012-11-09 MED ORDER — MIDAZOLAM HCL 5 MG/5ML IJ SOLN
INTRAMUSCULAR | Status: DC | PRN
  Administered 2012-11-09: 2 mg via INTRAVENOUS

## 2012-11-09 MED ORDER — FENTANYL CITRATE 0.05 MG/ML IJ SOLN
25.0000 ug | INTRAMUSCULAR | Status: DC | PRN
  Administered 2012-11-09 (×3): 50 ug via INTRAVENOUS

## 2012-11-09 MED ORDER — ACETAMINOPHEN 10 MG/ML IV SOLN
INTRAVENOUS | Status: AC
  Filled 2012-11-09: qty 100

## 2012-11-09 SURGICAL SUPPLY — 34 items
BANDAGE ELASTIC 6 VELCRO ST LF (GAUZE/BANDAGES/DRESSINGS) IMPLANT
BANDAGE ESMARK 6X9 LF (GAUZE/BANDAGES/DRESSINGS) ×1 IMPLANT
BANDAGE GAUZE ELAST BULKY 4 IN (GAUZE/BANDAGES/DRESSINGS) ×2 IMPLANT
BNDG COHESIVE 4X5 TAN STRL (GAUZE/BANDAGES/DRESSINGS) ×2 IMPLANT
BNDG ESMARK 6X9 LF (GAUZE/BANDAGES/DRESSINGS) ×2
CLOTH BEACON ORANGE TIMEOUT ST (SAFETY) ×2 IMPLANT
COVER SURGICAL LIGHT HANDLE (MISCELLANEOUS) ×2 IMPLANT
CUFF TOURN SGL QUICK 34 (TOURNIQUET CUFF)
CUFF TRNQT CYL 34X4X40X1 (TOURNIQUET CUFF) IMPLANT
DRAPE POUCH INSTRU U-SHP 10X18 (DRAPES) ×2 IMPLANT
DRAPE U-SHAPE 47X51 STRL (DRAPES) IMPLANT
DRSG ADAPTIC 3X8 NADH LF (GAUZE/BANDAGES/DRESSINGS) IMPLANT
DRSG PAD ABDOMINAL 8X10 ST (GAUZE/BANDAGES/DRESSINGS) ×2 IMPLANT
DURAPREP 26ML APPLICATOR (WOUND CARE) ×2 IMPLANT
GAUZE XEROFORM 4X4 STRL (GAUZE/BANDAGES/DRESSINGS) ×2 IMPLANT
GLOVE BIOGEL PI IND STRL 8 (GLOVE) ×1 IMPLANT
GLOVE BIOGEL PI INDICATOR 8 (GLOVE) ×1
GLOVE ECLIPSE 8.0 STRL XLNG CF (GLOVE) ×2 IMPLANT
GLOVE ORTHO TXT STRL SZ7.5 (GLOVE) ×2 IMPLANT
GOWN PREVENTION PLUS LG XLONG (DISPOSABLE) ×4 IMPLANT
GOWN STRL REIN XL XLG (GOWN DISPOSABLE) ×2 IMPLANT
KIT BASIN OR (CUSTOM PROCEDURE TRAY) ×2 IMPLANT
MANIFOLD NEPTUNE II (INSTRUMENTS) ×2 IMPLANT
NS IRRIG 1000ML POUR BTL (IV SOLUTION) ×2 IMPLANT
PACK LOWER EXTREMITY WL (CUSTOM PROCEDURE TRAY) ×2 IMPLANT
POSITIONER SURGICAL ARM (MISCELLANEOUS) ×2 IMPLANT
SPONGE GAUZE 4X4 12PLY (GAUZE/BANDAGES/DRESSINGS) ×2 IMPLANT
SUCTION FRAZIER 12FR DISP (SUCTIONS) ×2 IMPLANT
SUT ETHILON 4 0 PS 2 18 (SUTURE) ×2 IMPLANT
SUT PROLENE 4 0 RB 1 (SUTURE)
SUT PROLENE 4-0 RB1 .5 CRCL 36 (SUTURE) IMPLANT
TOWEL BLUE STERILE X RAY DET (MISCELLANEOUS) ×6 IMPLANT
TOWEL OR 17X26 10 PK STRL BLUE (TOWEL DISPOSABLE) ×4 IMPLANT
WATER STERILE IRR 1500ML POUR (IV SOLUTION) ×2 IMPLANT

## 2012-11-09 NOTE — Anesthesia Postprocedure Evaluation (Signed)
  Anesthesia Post-op Note  Patient: Beth [name]  Procedure(s) Performed: Procedure(s) (LRB): EXCISION RIGHT FOOT DORSAL MASS (Right)  Patient Location: PACU  Anesthesia Type: General  Level of Consciousness: awake and alert   Airway and Oxygen Therapy: Patient Spontanous Breathing  Post-op Pain: mild  Post-op Assessment: Post-op Vital signs reviewed, Patient's Cardiovascular Status Stable, Respiratory Function Stable, Patent Airway and No signs of Nausea or vomiting  Last Vitals:  Filed Vitals:   11/09/12 1132  BP: 101/72  Pulse: 82  Temp: 37.1 C  Resp: 18    Post-op Vital Signs: stable   Complications: No apparent anesthesia complications

## 2012-11-09 NOTE — Preoperative (Signed)
Beta Blockers   Reason not to administer Beta Blockers:Not Applicable 

## 2012-11-09 NOTE — Anesthesia Preprocedure Evaluation (Addendum)
Anesthesia Evaluation  Patient identified by MRN, date of birth, ID band Patient awake    Reviewed: Allergy & Precautions, H&P , NPO status , Patient's Chart, lab work & pertinent test results  Airway Mallampati: II TM Distance: <3 FB Neck ROM: Full    Dental no notable dental hx.    Pulmonary sleep apnea , COPDCurrent Smoker,  breath sounds clear to auscultation  Pulmonary exam normal       Cardiovascular hypertension, + CAD, + Past MI and + Cardiac Stents + dysrhythmias Atrial Fibrillation Rhythm:Regular Rate:Normal     Neuro/Psych negative neurological ROS  negative psych ROS   GI/Hepatic negative GI ROS, Neg liver ROS,   Endo/Other  negative endocrine ROSdiabetes, Insulin Dependent  Renal/GU negative Renal ROS  negative genitourinary   Musculoskeletal negative musculoskeletal ROS (+) Arthritis -, Rheumatoid disorders,    Abdominal   Peds negative pediatric ROS (+)  Hematology negative hematology ROS (+)   Anesthesia Other Findings   Reproductive/Obstetrics negative OB ROS                          Anesthesia Physical Anesthesia Plan  ASA: III  Anesthesia Plan: General   Post-op Pain Management:    Induction: Intravenous  Airway Management Planned: LMA  Additional Equipment:   Intra-op Plan:   Post-operative Plan:   Informed Consent: I have reviewed the patients History and Physical, chart, labs and discussed the procedure including the risks, benefits and alternatives for the proposed anesthesia with the patient or authorized representative who has indicated his/her understanding and acceptance.   Dental advisory given  Plan Discussed with: CRNA and Surgeon  Anesthesia Plan Comments:         Anesthesia Quick Evaluation

## 2012-11-09 NOTE — Brief Op Note (Signed)
11/09/2012  3:18 PM  PATIENT:  Carollee Leitz  62 y.o. female  PRE-OPERATIVE DIAGNOSIS:  Right foot dorsal mass  POST-OPERATIVE DIAGNOSIS:  Right foot dorsal mass  PROCEDURE:  Procedure(s): EXCISION RIGHT FOOT DORSAL MASS (Right)  SURGEON:  Surgeon(s) and Role:    * Kathryne Hitch, MD - Primary  PHYSICIAN ASSISTANT: Rexene Edison, PA-C  ANESTHESIA:   general  EBL:   minimal  BLOOD ADMINISTERED:none  DRAINS: none   LOCAL MEDICATIONS USED:  MARCAINE     SPECIMEN:  No Specimen  DISPOSITION OF SPECIMEN:  N/A  COUNTS:  YES  TOURNIQUET:    DICTATION: .Other Dictation: Dictation Number [id number]  PLAN OF CARE: Discharge to home after PACU  PATIENT DISPOSITION:  PACU - hemodynamically stable.   Delay start of Pharmacological VTE agent (>24hrs) due to surgical blood loss or risk of bleeding: not applicable

## 2012-11-09 NOTE — Transfer of Care (Signed)
Immediate Anesthesia Transfer of Care Note  Patient: Beth [name]  Procedure(s) Performed: Procedure(s): EXCISION RIGHT FOOT DORSAL MASS (Right)  Patient Location: PACU  Anesthesia Type:General  Level of Consciousness: sedated  Airway & Oxygen Therapy: Patient Spontanous Breathing and Patient connected to face mask oxygen  Post-op Assessment: Report given to PACU RN and Post -op Vital signs reviewed and stable  Post vital signs: Reviewed and stable  Complications: No apparent anesthesia complications

## 2012-11-09 NOTE — Progress Notes (Signed)
Orthopedic Tech Progress Note Patient Details:  Beth [name] 1950/10/30 [id number]  Ortho Devices Type of Ortho Device: Postop shoe/boot;Crutches Ortho Device/Splint Location: RLE Ortho Device/Splint Interventions: Application   Lesle Chris 11/09/2012, 8:54 PM

## 2012-11-09 NOTE — Progress Notes (Addendum)
Ortho tech into give patient crutches. She is familiar w how to ambulate w them

## 2012-11-09 NOTE — H&P (Signed)
Beth [name] is an 62 y.o. female.   Chief Complaint:   Large, painful mass on right foot HPI:   62 yo female with multiple medical problems who has had a slowly growing mass develop on the dorsum of her right foot.  It has gotten quite large and is painful.  She is also now unable to even put on regular shoes.  An MRI was obtained to assess for neoplasm.  It looks more benign in appearance.  She wishes to have this removed and I agree given its overall size.  It will need assessment by Pathology as well.  Past Medical History  Diagnosis Date  . Hypercholesterolemia   . Rheumatoid arthritis   . Tobacco abuse   . COPD (chronic obstructive pulmonary disease)   . Bronchitis, chronic   . Diabetes mellitus   . Atrial fibrillation   . Osteoporosis   . Multiple URI   . Complication of anesthesia     with hysterectomy - " quit breathing" - no ICU   . Acute MI 2006    hx of 2 MI   . CAD (coronary artery disease)     hx of stents 2006   . Pneumonia     hx of   . Sleep apnea     cpap- does not use   . Anemia     hx of   . Hepatitis     at age 40     Past Surgical History  Procedure Laterality Date  . Appendectomy    . Abdominal hysterectomy    . Stents  2007    5 cardiac stents  . Coronary angioplasty    . Breast surgery      age 21 left benign tumor removed   . Back surgery      kyphoplasty- 2010    Family History  Problem Relation Age of Onset  . COPD Mother   . Heart disease Father   . Diabetes Sister   . COPD Brother   . Diabetes Sister   . Diabetes Brother    Social History:  reports that she has been smoking Cigarettes.  She has a 37 pack-year smoking history. She has never used smokeless tobacco. She reports that she does not drink alcohol or use illicit drugs.  Allergies:  Allergies  Allergen Reactions  . Ciprofloxacin     Nausea   . Codeine Rash    No prescriptions prior to admission    Results for orders placed during the hospital encounter of  11/07/12 (from the past 48 hour(s))  SURGICAL PCR SCREEN     Status: None   Collection Time    11/07/12  9:45 AM      Result Value Range   MRSA, PCR NEGATIVE  NEGATIVE   Staphylococcus aureus NEGATIVE  NEGATIVE   Comment:            The Xpert SA Assay (FDA     approved for NASAL specimens     in patients over 58 years of age),     is one component of     a comprehensive surveillance     program.  Test performance has     been validated by [employer] for patients greater     than or equal to 14 year old.     It is not intended     to diagnose infection nor to     guide or monitor treatment.  CBC  Status: Abnormal   Collection Time    11/07/12  9:50 AM      Result Value Range   WBC 5.5  4.0 - 10.5 K/uL   RBC 5.11  3.87 - 5.11 MIL/uL   Hemoglobin 16.9 (*) 12.0 - 15.0 g/dL   HCT 96.0 (*) 45.4 - 09.8 %   MCV 93.5  78.0 - 100.0 fL   MCH 33.1  26.0 - 34.0 pg   MCHC 35.4  30.0 - 36.0 g/dL   RDW 11.9  14.7 - 82.9 %   Platelets 133 (*) 150 - 400 K/uL  COMPREHENSIVE METABOLIC PANEL     Status: Abnormal   Collection Time    11/07/12  9:50 AM      Result Value Range   Sodium 133 (*) 135 - 145 mEq/L   Potassium 4.6  3.5 - 5.1 mEq/L   Chloride 97  96 - 112 mEq/L   CO2 28  19 - 32 mEq/L   Glucose, Bld 353 (*) 70 - 99 mg/dL   BUN 6  6 - 23 mg/dL   Creatinine, Ser 5.62  0.50 - 1.10 mg/dL   Calcium 9.7  8.4 - 13.0 mg/dL   Total Protein 8.1  6.0 - 8.3 g/dL   Albumin 3.0 (*) 3.5 - 5.2 g/dL   AST 17  0 - 37 U/L   ALT 11  0 - 35 U/L   Alkaline Phosphatase 87  39 - 117 U/L   Total Bilirubin 0.4  0.3 - 1.2 mg/dL   GFR calc non Af Amer >90  >90 mL/min   GFR calc Af Amer >90  >90 mL/min   Comment:            The eGFR has been calculated     using the CKD EPI equation.     This calculation has not been     validated in all clinical     situations.     eGFR's persistently     <90 mL/min signify     possible Chronic Kidney Disease.   Dg Chest 2 View  11/07/2012  *RADIOLOGY  REPORT*  Clinical Data: Hypertension and diabetes  CHEST - 2 VIEW  Comparison: Chest radiograph 03/18/2010  Findings: Normal mediastinum and cardiac silhouette.  Normal pulmonary  vasculature.  No evidence of effusion, infiltrate, or pneumothorax.  No acute bony abnormality.  Lungs are hyperinflated. Augmentation cement noted within the thoracic and lumbar spine compression fractures.  No new fracture.  IMPRESSION: Hyperinflated lungs.  No acute findings.   Original Report Authenticated By: Genevive Bi, M.D.     ROS  There were no vitals taken for this visit. Physical Exam  Constitutional: She is oriented to person, place, and time. She appears well-developed and well-nourished.  HENT:  Head: Normocephalic and atraumatic.  Eyes: Pupils are equal, round, and reactive to light.  Neck: Normal range of motion.  Cardiovascular: Normal rate.   Respiratory: Effort normal and breath sounds normal.  GI: Soft. Bowel sounds are normal.  Musculoskeletal:       Feet:  Neurological: She is alert and oriented to person, place, and time.  Skin: Skin is warm and dry.     Assessment/Plan Large soft-tissue mass on the dorsum of her right foot 1) To the OR today for excision of this large soft-tissue mass.  Kathryne Hitch 11/09/2012, 9:27 AM

## 2012-11-10 NOTE — Op Note (Signed)
Beth [name], Beth [name] NO.:  [id number]  MEDICAL RECORD NO.:  [id number]  LOCATION:  [facility]                         FACILITY:  [facility]  PHYSICIAN:  Vanita Panda. Magnus Ivan, M.D.DATE OF BIRTH:  09-07-1950  DATE OF PROCEDURE:  11/09/2012 DATE OF DISCHARGE:  11/09/2012                              OPERATIVE REPORT   PREOPERATIVE DIAGNOSIS:  Large soft tissue mass, dorsum of right foot.  POSTOPERATIVE DIAGNOSIS:  Large soft tissue mass, dorsum of right foot.  PROCEDURE:  Excision of right dorsal foot mass.  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  SURGEON:  Vanita Panda. Magnus Ivan, M.D.  ASSISTANT:  Richardean Canal, P.A.  ANESTHESIA: 1. General. 2. Local with 0.25% plain Sensorcaine.  BLOOD LOSS:  Minimal.  COMPLICATIONS:  None.  INDICATIONS:  [name] is a 62 year old female with multiple medical problems and in poor health with a very large dorsal soft tissue mass on her right foot.  This is preventing her to getting shoes on.  It has been going for quite some period of time.  An MRI was more consistent with an arthritic process.  Due to the fact that she cannot get her on shoe anymore and it is very painful, she wished to have this excised. The risks and benefits were explained to her in detail including the risk of infection, given her poorly-controlled diabetes.  PROCEDURE DESCRIPTION:  After informed consent was obtained and appropriate right foot was marked, she was brought to the operating room and placed supine on the operating table, general anesthesia was obtained.  Her right foot and ankle were prepped and draped with DuraPrep and sterile drapes.  Time-out was called and she was identified as correct patient and correct right ankle.  I then put a towel around the ankle and use an Esmarch as a local tourniquet.  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  We over to remove at all its entirety and then copiously irrigated the soft tissue with normal saline solution.  We used a rongeur to clean any other tissue from the area.  We then ellipsed out some of the skin, that has been stretch so long and let the tourniquet around the ankle down. Hemostasis was obtained using electrocautery.  We reapproximated the skin with interrupted 3-0 nylon suture.  We infiltrated the incision with 0.25% plain Sensorcaine.  Placed well-padded sterile dressing.  She was awakened, extubated, and taken to the recovery room in stable condition.  All final counts were correct.  There were no complications noted.  Of note, Richardean Canal, PA-C was present during the entire case and his presence was helpful with excising the mass.     Vanita Panda. Magnus Ivan, M.D.     CYB/MEDQ  D:  11/09/2012  T:  11/10/2012  Job:  [id number]

## 2012-11-12 ENCOUNTER — Telehealth: Payer: Self-pay

## 2012-11-12 ENCOUNTER — Encounter (HOSPITAL_COMMUNITY): Payer: Self-pay | Admitting: Orthopaedic Surgery

## 2012-11-12 NOTE — Telephone Encounter (Signed)
Patient called saying she is having to take more pills because of her surgery.

## 2012-11-13 NOTE — Telephone Encounter (Signed)
She should not take more tablets a day, she is already taking 5 oxycodone 10mg  per day, and on her script it says do not take more than 5, maybe she can put a cold pad on her foot for further relief

## 2012-11-14 NOTE — Telephone Encounter (Signed)
Patient informed that if she takes more of her medication this is a contract violation and she could be discharged.  Patient advised to try cold pad.  She says she has done this and took extra pills for three days.  Advised her that we will not fill early if she is out early.  Patient will follow up at her appointment.

## 2012-11-16 ENCOUNTER — Telehealth: Payer: Self-pay | Admitting: *Deleted

## 2012-11-16 ENCOUNTER — Encounter
Payer: Medicaid Other | Attending: Physical Medicine and Rehabilitation | Admitting: Physical Medicine and Rehabilitation

## 2012-11-16 ENCOUNTER — Encounter: Payer: Self-pay | Admitting: Physical Medicine and Rehabilitation

## 2012-11-16 VITALS — BP 120/72 | HR 96 | Resp 16 | Ht 59.0 in | Wt 137.0 lb

## 2012-11-16 DIAGNOSIS — E119 Type 2 diabetes mellitus without complications: Secondary | ICD-10-CM | POA: Insufficient documentation

## 2012-11-16 DIAGNOSIS — M069 Rheumatoid arthritis, unspecified: Secondary | ICD-10-CM

## 2012-11-16 DIAGNOSIS — M545 Low back pain, unspecified: Secondary | ICD-10-CM | POA: Insufficient documentation

## 2012-11-16 DIAGNOSIS — M25579 Pain in unspecified ankle and joints of unspecified foot: Secondary | ICD-10-CM

## 2012-11-16 DIAGNOSIS — G609 Hereditary and idiopathic neuropathy, unspecified: Secondary | ICD-10-CM | POA: Insufficient documentation

## 2012-11-16 DIAGNOSIS — G8929 Other chronic pain: Secondary | ICD-10-CM | POA: Insufficient documentation

## 2012-11-16 DIAGNOSIS — M25571 Pain in right ankle and joints of right foot: Secondary | ICD-10-CM

## 2012-11-16 MED ORDER — OXYCODONE-ACETAMINOPHEN 10-325 MG PO TABS: 1.0000 | ORAL_TABLET | ORAL | Status: DC | PRN

## 2012-11-16 NOTE — Telephone Encounter (Signed)
Spoke with Beth [name] triage nurse about Beth [name] having red, warm incision site with drainage from recent surgery and needs to be seen. Appt set for 1 pm with Dr Prince Rome. Beth [name] in our office and message given to her.

## 2012-11-16 NOTE — Patient Instructions (Addendum)
Rest elevate your foot, try cooling pads for your right foot. Follow up with Dr. Magnus Ivan at 1 pm ( we sat up the appointment ) , incision looks red warm and some yellowish drainage.

## 2012-11-16 NOTE — Progress Notes (Signed)
Subjective:    Patient ID: Beth [name], female    DOB: 08-20-1950, 62 y.o.   MRN: [id number]  HPI The patient complains about chronic back pain . The patient denies any radiation into her LEs.This problem has been stable.  The patient reports that her right foot mass was surgically removed by Dr. Magnus Ivan on 04/25/ 14. She states, that after the surgery she was in severe pain and took about 1 pill extra/ day. The patient reports that she had noticed some yellowish drainage, coming out of her incision.   The patient states that the numbness and tingling in her right foot has improved.    Pain Inventory Average Pain 9 Pain Right Now 7 My pain is n/a  In the last 24 hours, has pain interfered with the following? General activity 8 Relation with others 8 Enjoyment of life 8 What TIME of day is your pain at its worst? morning,night time. Sleep (in general) n/a  Pain is worse with: standing Pain improves with: medication Relief from Meds: n/a  Mobility use a cane ability to climb steps?  no do you drive?  no Do you have any goals in this area?  no  Function Do you have any goals in this area?  no  Neuro/Psych depression  Prior Studies Any changes since last visit?  yes x-rays CT/MRI  Physicians involved in your care Any changes since last visit?  no   Family History  Problem Relation Age of Onset  . COPD Mother   . Heart disease Father   . Diabetes Sister   . COPD Brother   . Diabetes Sister   . Diabetes Brother    History   Social History  . Marital Status: Divorced    Spouse Name: N/A    Number of Children: N/A  . Years of Education: N/A   Social History Main Topics  . Smoking status: Current Every Day Smoker -- 1.00 packs/day for 37 years    Types: Cigarettes  . Smokeless tobacco: Never Used  . Alcohol Use: No  . Drug Use: No  . Sexually Active: None   Other Topics Concern  . None   Social History Narrative  . None   Past Surgical  History  Procedure Laterality Date  . Appendectomy    . Abdominal hysterectomy    . Stents  2007    5 cardiac stents  . Coronary angioplasty    . Breast surgery      age 7 left benign tumor removed   . Back surgery      kyphoplasty- 2010  . Mass excision Right 11/09/2012    Procedure: EXCISION RIGHT FOOT DORSAL MASS;  Surgeon: Kathryne Hitch, MD;  Location: [facility];  Service: Orthopedics;  Laterality: Right;  . Foot surgery Right 11-09-12   Past Medical History  Diagnosis Date  . Hypercholesterolemia   . Rheumatoid arthritis   . Tobacco abuse   . COPD (chronic obstructive pulmonary disease)   . Bronchitis, chronic   . Diabetes mellitus   . Atrial fibrillation   . Osteoporosis   . Multiple URI   . Complication of anesthesia     with hysterectomy - " quit breathing" - no ICU   . Acute MI 2006    hx of 2 MI   . CAD (coronary artery disease)     hx of stents 2006   . Pneumonia     hx of   . Sleep apnea  cpap- does not use   . Anemia     hx of   . Hepatitis     at age 49    BP 120/72  Pulse 96  Resp 16  Ht 4\' 11"  (1.499 m)  Wt 137 lb (62.143 kg)  BMI 27.66 kg/m2  SpO2 97%     Review of Systems  Psychiatric/Behavioral: Positive for dysphoric mood.  All other systems reviewed and are negative.       Objective:   Physical Exam Constitutional: She is oriented to person, place, and time. She appears well-developed.  HENT:  Head: Normocephalic.  Eyes: Pupils are equal, round, and reactive to light.  Neck: Neck supple.  Neurological: She is alert and oriented to person, place, and time.  Skin: Skin is warm and dry. About 9cm incision on dorsum of right foot, redness, increased temperature and yellowish drainage on the gaze. Psychiatric: She has a normal mood and affect.  Symmetric normal motor tone is noted throughout. Normal muscle bulk. Muscle testing reveals 5/5 muscle strength of the upper extremity, and 5/5 of the lower extremity. Full range of  motion in upper and lower extremities, except right wrist . ROM of spine is Restricted, kyphotic. Deformity of right wrist.  DTR in the upper and lower extremity are present and symmetric 2+. No clonus is noted.  Patient arises from chair with difficulty. Wide based gait with a cane, thoracic kyphosis ++ .  Extreme tenderness to even light touch on her entire back.         Assessment & Plan:  This is a 62 year old female with  1.LBP , some increased LBP, with radiating burning pain into her right posterior hip, increased her Lyrica to 75mg  bid.  2.Peripheral neuropathy  3.RA  4.DM  5. Surgical removal of right foot mass, incision 9 cm, sutures, redness and increased temperature around incision, yellowish drainage on gaze. We called Dr. Magnus Ivan 's office to set up an appointment for today at 1 pm, so he can evaluate the incision site. Patient has DM, and smokes. Plan :  Continue with medications,patient can refill her Oxycodone 2 days early, because she had legitimate increased pain after the surgery, and took about 1 tablet more per day.  Advised patient to cut down further on her smoking, she states, that she is down to half a pack per day now. Advised patient to rest and elevate her right foot, she can use cooling pads for further pain relief.do some exercises and stretches in her hot tub, if she can safely get in and out of it. Also she should stay as active as possible and walk as much as tolerated.  Follow up in 1 month.

## 2012-11-27 ENCOUNTER — Telehealth: Payer: Self-pay | Admitting: Family Medicine

## 2012-11-27 NOTE — Telephone Encounter (Signed)
Please call patient to schedule diabetes follow up appointment with Dr. Tye Savoy any day in June.  Thanks.

## 2012-11-30 ENCOUNTER — Encounter: Payer: Self-pay | Admitting: Family Medicine

## 2012-12-14 ENCOUNTER — Encounter
Payer: Medicaid Other | Attending: Physical Medicine and Rehabilitation | Admitting: Physical Medicine and Rehabilitation

## 2012-12-14 ENCOUNTER — Encounter: Payer: Self-pay | Admitting: Physical Medicine and Rehabilitation

## 2012-12-14 VITALS — BP 115/71 | HR 99 | Resp 15 | Ht 59.0 in | Wt 136.0 lb

## 2012-12-14 DIAGNOSIS — E119 Type 2 diabetes mellitus without complications: Secondary | ICD-10-CM | POA: Insufficient documentation

## 2012-12-14 DIAGNOSIS — M069 Rheumatoid arthritis, unspecified: Secondary | ICD-10-CM | POA: Insufficient documentation

## 2012-12-14 DIAGNOSIS — G609 Hereditary and idiopathic neuropathy, unspecified: Secondary | ICD-10-CM | POA: Insufficient documentation

## 2012-12-14 DIAGNOSIS — E1142 Type 2 diabetes mellitus with diabetic polyneuropathy: Secondary | ICD-10-CM

## 2012-12-14 DIAGNOSIS — M545 Low back pain, unspecified: Secondary | ICD-10-CM | POA: Insufficient documentation

## 2012-12-14 DIAGNOSIS — E1149 Type 2 diabetes mellitus with other diabetic neurological complication: Secondary | ICD-10-CM

## 2012-12-14 DIAGNOSIS — M25559 Pain in unspecified hip: Secondary | ICD-10-CM | POA: Insufficient documentation

## 2012-12-14 DIAGNOSIS — G8929 Other chronic pain: Secondary | ICD-10-CM | POA: Insufficient documentation

## 2012-12-14 MED ORDER — PREGABALIN 25 MG PO CAPS: 75.0000 mg | ORAL_CAPSULE | Freq: Two times a day (BID) | ORAL | Status: DC

## 2012-12-14 MED ORDER — OXYCODONE-ACETAMINOPHEN 10-325 MG PO TABS: 1.0000 | ORAL_TABLET | ORAL | Status: DC | PRN

## 2012-12-14 NOTE — Progress Notes (Signed)
Subjective:    Patient ID: Beth [name], female    DOB: 1950-10-21, 62 y.o.   MRN: [id number]  HPI The patient complains about chronic back pain . The patient denies any radiation into her LEs.This problem has been stable.  The patient reports that her right foot mass was surgically removed by Dr. Magnus Ivan on 04/25/ 14. She states, that after the surgery she was in severe pain and took about 1 pill extra/ day. The patient reports that she had noticed some yellowish drainage, coming out of her incision at the last visit, we made an appointment for her with Dr. Magnus Ivan, she had an infection in her surgical wound, she was put on Doxycycline, for 10 days, she saw Dr. Magnus Ivan again yesterday and he prescribed another round of Doxycycline for 10 days.She will follow up with him again in 2 weeks .  The patient states that the numbness and tingling in her right foot has improved.   Pain Inventory Average Pain 8 Pain Right Now 7 My pain is sharp and aching  In the last 24 hours, has pain interfered with the following? General activity 9 Relation with others 9 Enjoyment of life 9 What TIME of day is your pain at its worst? morning Sleep (in general) Poor  Pain is worse with: walking, bending, sitting and standing Pain improves with: medication Relief from Meds: 5  Mobility use a cane how many minutes can you walk? 5 ability to climb steps?  no do you drive?  no needs help with transfers Do you have any goals in this area?  no  Function disabled: date disabled na I need assistance with the following:  bathing  Neuro/Psych spasms depression  Prior Studies Any changes since last visit?  no  Physicians involved in your care Any changes since last visit?  no   Family History  Problem Relation Age of Onset  . COPD Mother   . Heart disease Father   . Diabetes Sister   . COPD Brother   . Diabetes Sister   . Diabetes Brother    History   Social History  . Marital  Status: Divorced    Spouse Name: N/A    Number of Children: N/A  . Years of Education: N/A   Social History Main Topics  . Smoking status: Current Every Day Smoker -- 1.00 packs/day for 37 years    Types: Cigarettes  . Smokeless tobacco: Never Used  . Alcohol Use: No  . Drug Use: No  . Sexually Active: None   Other Topics Concern  . None   Social History Narrative  . None   Past Surgical History  Procedure Laterality Date  . Appendectomy    . Abdominal hysterectomy    . Stents  2007    5 cardiac stents  . Coronary angioplasty    . Breast surgery      age 76 left benign tumor removed   . Back surgery      kyphoplasty- 2010  . Mass excision Right 11/09/2012    Procedure: EXCISION RIGHT FOOT DORSAL MASS;  Surgeon: Kathryne Hitch, MD;  Location: [facility];  Service: Orthopedics;  Laterality: Right;  . Foot surgery Right 11-09-12   Past Medical History  Diagnosis Date  . Hypercholesterolemia   . Rheumatoid arthritis(714.0)   . Tobacco abuse   . COPD (chronic obstructive pulmonary disease)   . Bronchitis, chronic   . Diabetes mellitus   . Atrial fibrillation   . Osteoporosis   .  Multiple URI   . Complication of anesthesia     with hysterectomy - " quit breathing" - no ICU   . Acute MI 2006    hx of 2 MI   . CAD (coronary artery disease)     hx of stents 2006   . Pneumonia     hx of   . Sleep apnea     cpap- does not use   . Anemia     hx of   . Hepatitis     at age 42    BP 115/71  Pulse 99  Resp 15  Ht 4\' 11"  (1.499 m)  Wt 136 lb (61.689 kg)  BMI 27.45 kg/m2  SpO2 95%     Review of Systems  Respiratory: Positive for apnea.   Gastrointestinal: Positive for nausea.  Neurological:       Spasms  Psychiatric/Behavioral: Positive for dysphoric mood.  All other systems reviewed and are negative.       Objective:   Physical Exam Constitutional: She is oriented to person, place, and time. She appears well-developed.  HENT:  Head:  Normocephalic.  Eyes: Pupils are equal, round, and reactive to light.  Neck: Neck supple.  Neurological: She is alert and oriented to person, place, and time.  Skin: Skin is warm and dry. About 9cm incision on dorsum of right foot,mild redness, but otherwise looks clean. Psychiatric: She has a normal mood and affect.  Symmetric normal motor tone is noted throughout. Normal muscle bulk. Muscle testing reveals 5/5 muscle strength of the upper extremity, and 5/5 of the lower extremity. Full range of motion in upper and lower extremities, except right wrist . ROM of spine is Restricted, kyphotic. Deformity of right wrist.  DTR in the upper and lower extremity are present and symmetric 2+. No clonus is noted.  Patient arises from chair with difficulty. Wide based gait with a cane, thoracic kyphosis ++ .         Assessment & Plan:  This is a 62 year old female with  1.LBP , some increased LBP, with radiating burning pain into her right posterior hip, increased her Lyrica to 75mg  bid.  2.Peripheral neuropathy  3.RA  4.DM  5. Surgical removal of right foot mass, incision 9 cm,which looked infected at her last visit, we made an appointment for her with Dr. Magnus Ivan, she had an infection in her surgical wound, she was put on Doxycycline, for 10 days, she saw Dr. Magnus Ivan again yesterday and he prescribed another round of Doxycycline for 10 days.She will follow up with him again in 2 weeks .  Patient has DM, and smokes.  Plan :  Continue with medications,patient refilled her Oxycodone, and Lyrica.   Advised patient to cut down further on her smoking, she states, that she is down to half a pack per day now. Advised patient to rest and elevate her right foot, she can use cooling pads for further pain relief. Also she should stay as active as possible and walk as much as tolerated.  Follow up in 1 month.

## 2012-12-14 NOTE — Patient Instructions (Signed)
Stay as active as pain permits 

## 2013-01-11 ENCOUNTER — Encounter: Payer: Medicaid Other | Admitting: Physical Medicine and Rehabilitation

## 2013-01-14 ENCOUNTER — Encounter
Payer: Medicaid Other | Attending: Physical Medicine and Rehabilitation | Admitting: Physical Medicine and Rehabilitation

## 2013-01-14 ENCOUNTER — Encounter: Payer: Self-pay | Admitting: Physical Medicine and Rehabilitation

## 2013-01-14 VITALS — BP 119/75 | HR 96 | Resp 16 | Ht 59.0 in | Wt 134.0 lb

## 2013-01-14 DIAGNOSIS — G609 Hereditary and idiopathic neuropathy, unspecified: Secondary | ICD-10-CM | POA: Insufficient documentation

## 2013-01-14 DIAGNOSIS — E119 Type 2 diabetes mellitus without complications: Secondary | ICD-10-CM | POA: Insufficient documentation

## 2013-01-14 DIAGNOSIS — M545 Low back pain, unspecified: Secondary | ICD-10-CM | POA: Insufficient documentation

## 2013-01-14 DIAGNOSIS — M069 Rheumatoid arthritis, unspecified: Secondary | ICD-10-CM

## 2013-01-14 DIAGNOSIS — G8929 Other chronic pain: Secondary | ICD-10-CM | POA: Insufficient documentation

## 2013-01-14 MED ORDER — METHOCARBAMOL 500 MG PO TABS: 500.0000 mg | ORAL_TABLET | Freq: Three times a day (TID) | ORAL | Status: DC

## 2013-01-14 MED ORDER — OXYCODONE-ACETAMINOPHEN 10-325 MG PO TABS: 1.0000 | ORAL_TABLET | ORAL | Status: DC | PRN

## 2013-01-14 NOTE — Patient Instructions (Addendum)
Try to stay as active as tolerated. Take a good look at your wound, keep it clean, go to your physician or to the ED if you notice redness, swelling, pain or yellowish drain .

## 2013-01-14 NOTE — Progress Notes (Signed)
Subjective:    Patient ID: Beth [name], female    DOB: [birth year], 62 y.o.   MRN: [id number]  HPI The patient complains about chronic back pain . The patient denies any radiation into her LEs.This problem has been stable.  The patient reports that her right foot mass was surgically removed by Dr. Magnus Ivan on 04/25/ 14. She states, that after the surgery she was in severe pain and took about 1 pill extra/ day. The patient reports that she had noticed some yellowish drainage, coming out of her incision at the last visit, we made an appointment for her with Dr. Magnus Ivan, she had an infection in her surgical wound, she was put on Doxycycline, for 10 days. She states, that she does not want to follow up with them anymore, she states, that her wound looks fine, and she was not happy with the service she received at  Dr. Eliberto Ivory office.  The patient states that the numbness and tingling in her right foot has improved.   Pain Inventory Average Pain 8 Pain Right Now 8 My pain is sharp, burning and aching  In the last 24 hours, has pain interfered with the following? General activity 8 Relation with others 8 Enjoyment of life na What TIME of day is your pain at its worst? morning,night Sleep (in general) Fair  Pain is worse with: walking, sitting and standing Pain improves with: na Relief from Meds: na  Mobility use a cane Do you have any goals in this area?  yes  Function Do you have any goals in this area?  no  Neuro/Psych No problems in this area  Prior Studies Any changes since last visit?  no  Physicians involved in your care Any changes since last visit?  no   Family History  Problem Relation Age of Onset  . COPD Mother   . Heart disease Father   . Diabetes Sister   . COPD Brother   . Diabetes Sister   . Diabetes Brother    History   Social History  . Marital Status: Divorced    Spouse Name: N/A    Number of Children: N/A  . Years of Education: N/A    Social History Main Topics  . Smoking status: Current Every Day Smoker -- 1.00 packs/day for 37 years    Types: Cigarettes  . Smokeless tobacco: Never Used  . Alcohol Use: No  . Drug Use: No  . Sexually Active: None   Other Topics Concern  . None   Social History Narrative  . None   Past Surgical History  Procedure Laterality Date  . Appendectomy    . Abdominal hysterectomy    . Stents  2007    5 cardiac stents  . Coronary angioplasty    . Breast surgery      age 39 left benign tumor removed   . Back surgery      kyphoplasty- 2010  . Mass excision Right 11/09/2012    Procedure: EXCISION RIGHT FOOT DORSAL MASS;  Surgeon: Kathryne Hitch, MD;  Location: [facility];  Service: Orthopedics;  Laterality: Right;  . Foot surgery Right 11-09-12   Past Medical History  Diagnosis Date  . Hypercholesterolemia   . Rheumatoid arthritis(714.0)   . Tobacco abuse   . COPD (chronic obstructive pulmonary disease)   . Bronchitis, chronic   . Diabetes mellitus   . Atrial fibrillation   . Osteoporosis   . Multiple URI   . Complication of anesthesia  with hysterectomy - " quit breathing" - no ICU   . Acute MI 2006    hx of 2 MI   . CAD (coronary artery disease)     hx of stents 2006   . Pneumonia     hx of   . Sleep apnea     cpap- does not use   . Anemia     hx of   . Hepatitis     at age 16    BP 119/75  Pulse 96  Resp 16  Ht 4\' 11"  (1.499 m)  Wt 134 lb (60.782 kg)  BMI 27.05 kg/m2  SpO2 93%     Review of Systems  Gastrointestinal: Positive for nausea.  All other systems reviewed and are negative.       Objective:   Physical Exam Constitutional: She is oriented to person, place, and time. She appears well-developed.  HENT:  Head: Normocephalic.  Eyes: Pupils are equal, round, and reactive to light.  Neck: Neck supple.  Neurological: She is alert and oriented to person, place, and time.  Skin: Skin is warm and dry. Incision on dorsum of right  foot,mild redness around bandage,patient did not want me to take bandage off. No swelling, no increased temperature around wound. Psychiatric: She has a normal mood and affect.  Symmetric normal motor tone is noted throughout. Normal muscle bulk. Muscle testing reveals 5/5 muscle strength of the upper extremity, and 5/5 of the lower extremity. Full range of motion in upper and lower extremities, except right wrist . ROM of spine is Restricted, kyphotic. Deformity of right wrist.  DTR in the upper and lower extremity are present and symmetric 2+. No clonus is noted.  Patient arises from chair with difficulty. Wide based gait with a cane, thoracic kyphosis ++ .         Assessment & Plan:  This is a 62 year old female with  1.LBP , some increased LBP, with radiating burning pain into her right posterior hip, increased her Lyrica to 75mg  bid.  2.Peripheral neuropathy  3.RA  4.DM  5. Surgical removal of right foot mass, incision 9 cm,which looked infected at her last visit, we made an appointment for her with Dr. Magnus Ivan, she had an infection in her surgical wound, she was put on Doxycycline, for 10 days, she saw Dr. Magnus Ivan again and he prescribed another round of Doxycycline for 10 days which she has finished a couple weeks ago. She did not follow up with Dr. Magnus Ivan, she states, that she did not want to follow up with them anymore. She states, that her wound looks clean now and is healing, and she was not happy with the service she received at Dr. Eliberto Ivory office.  I advised her to follow up with them, or go to another physician or to the ED if she notices increased redness, temperature, swelling yellowish drainage.  Patient has DM, and smokes.  Plan :  Continue with medications,patient refilled her Oxycodone.  Advised patient to cut down further on her smoking, she states, that she is down to half a pack per day now. Advised patient to rest and elevate her right foot, she can use cooling  pads for further pain relief. Also she should stay as active as possible and walk as much as tolerated.  Follow up in 1 month.

## 2013-01-24 ENCOUNTER — Other Ambulatory Visit: Payer: Self-pay

## 2013-02-08 ENCOUNTER — Encounter
Payer: Medicaid Other | Attending: Physical Medicine and Rehabilitation | Admitting: Physical Medicine and Rehabilitation

## 2013-02-08 ENCOUNTER — Encounter: Payer: Self-pay | Admitting: Physical Medicine and Rehabilitation

## 2013-02-08 VITALS — BP 104/66 | HR 95 | Resp 16 | Ht 59.0 in | Wt 134.0 lb

## 2013-02-08 DIAGNOSIS — S91301A Unspecified open wound, right foot, initial encounter: Secondary | ICD-10-CM

## 2013-02-08 DIAGNOSIS — X58XXXA Exposure to other specified factors, initial encounter: Secondary | ICD-10-CM | POA: Insufficient documentation

## 2013-02-08 DIAGNOSIS — E119 Type 2 diabetes mellitus without complications: Secondary | ICD-10-CM | POA: Insufficient documentation

## 2013-02-08 DIAGNOSIS — M549 Dorsalgia, unspecified: Secondary | ICD-10-CM | POA: Insufficient documentation

## 2013-02-08 DIAGNOSIS — M545 Low back pain, unspecified: Secondary | ICD-10-CM

## 2013-02-08 DIAGNOSIS — F17200 Nicotine dependence, unspecified, uncomplicated: Secondary | ICD-10-CM | POA: Insufficient documentation

## 2013-02-08 DIAGNOSIS — S91309A Unspecified open wound, unspecified foot, initial encounter: Secondary | ICD-10-CM

## 2013-02-08 DIAGNOSIS — G609 Hereditary and idiopathic neuropathy, unspecified: Secondary | ICD-10-CM | POA: Insufficient documentation

## 2013-02-08 DIAGNOSIS — L089 Local infection of the skin and subcutaneous tissue, unspecified: Secondary | ICD-10-CM | POA: Insufficient documentation

## 2013-02-08 DIAGNOSIS — Z79899 Other long term (current) drug therapy: Secondary | ICD-10-CM | POA: Insufficient documentation

## 2013-02-08 DIAGNOSIS — M069 Rheumatoid arthritis, unspecified: Secondary | ICD-10-CM

## 2013-02-08 MED ORDER — PREGABALIN 25 MG PO CAPS: 75.0000 mg | ORAL_CAPSULE | Freq: Two times a day (BID) | ORAL | Status: DC

## 2013-02-08 MED ORDER — OXYCODONE-ACETAMINOPHEN 10-325 MG PO TABS: 1.0000 | ORAL_TABLET | ORAL | Status: DC | PRN

## 2013-02-08 MED ORDER — METHOCARBAMOL 500 MG PO TABS: 500.0000 mg | ORAL_TABLET | Freq: Three times a day (TID) | ORAL | Status: DC

## 2013-02-08 NOTE — Patient Instructions (Signed)
Follow up with wound care for your open wound

## 2013-02-08 NOTE — Progress Notes (Signed)
Subjective:    Patient ID: Beth [name], female    DOB: [birth year], 62 y.o.   MRN: [id number]  HPI The patient complains about chronic back pain . The patient denies any radiation into her LEs.This problem has gotten worse, she complains about more severe pain.  The patient reports that her right foot mass was surgically removed by Dr. Magnus Ivan on 04/25/ 14. She states, that after the surgery she was in severe pain and took about 1 pill extra/ day. The patient reports that she had noticed some yellowish drainage, coming out of her incision at the last visit, we made an appointment for her with Dr. Magnus Ivan, she had an infection in her surgical wound, she was put on Doxycycline, for 10 days. She states, that she does not want to follow up with them anymore,  and she was not happy with the service she received at Dr. Eliberto Ivory office.  The patient states that the numbness and tingling in her right foot has improved.   Pain Inventory Average Pain 8 Pain Right Now 9 My pain is aching  In the last 24 hours, has pain interfered with the following? General activity 8 Relation with others 8 Enjoyment of life 8 What TIME of day is your pain at its worst? na Sleep (in general) Fair  Pain is worse with: walking and standing Pain improves with: medication Relief from Meds: 7  Mobility use a cane Do you have any goals in this area?  yes  Function Do you have any goals in this area?  no  Neuro/Psych No problems in this area  Prior Studies Any changes since last visit?  no  Physicians involved in your care Any changes since last visit?  no   Family History  Problem Relation Age of Onset  . COPD Mother   . Heart disease Father   . Diabetes Sister   . COPD Brother   . Diabetes Sister   . Diabetes Brother    History   Social History  . Marital Status: Divorced    Spouse Name: N/A    Number of Children: N/A  . Years of Education: N/A   Social History Main Topics  .  Smoking status: Current Every Day Smoker -- 1.00 packs/day for 37 years    Types: Cigarettes  . Smokeless tobacco: Never Used  . Alcohol Use: No  . Drug Use: No  . Sexually Active: None   Other Topics Concern  . None   Social History Narrative  . None   Past Surgical History  Procedure Laterality Date  . Appendectomy    . Abdominal hysterectomy    . Stents  2007    5 cardiac stents  . Coronary angioplasty    . Breast surgery      age 45 left benign tumor removed   . Back surgery      kyphoplasty- 2010  . Mass excision Right 11/09/2012    Procedure: EXCISION RIGHT FOOT DORSAL MASS;  Surgeon: Kathryne Hitch, MD;  Location: [facility];  Service: Orthopedics;  Laterality: Right;  . Foot surgery Right 11-09-12   Past Medical History  Diagnosis Date  . Hypercholesterolemia   . Rheumatoid arthritis(714.0)   . Tobacco abuse   . COPD (chronic obstructive pulmonary disease)   . Bronchitis, chronic   . Diabetes mellitus   . Atrial fibrillation   . Osteoporosis   . Multiple URI   . Complication of anesthesia     with hysterectomy - "  quit breathing" - no ICU   . Acute MI 2006    hx of 2 MI   . CAD (coronary artery disease)     hx of stents 2006   . Pneumonia     hx of   . Sleep apnea     cpap- does not use   . Anemia     hx of   . Hepatitis     at age 65    BP 104/66  Pulse 95  Resp 16  Ht 4\' 11"  (1.499 m)  Wt 134 lb (60.782 kg)  BMI 27.05 kg/m2  SpO2 96%     Review of Systems  Constitutional: Positive for appetite change.  Respiratory: Positive for apnea.   Gastrointestinal: Positive for nausea.  All other systems reviewed and are negative.       Objective:   Physical Exam  Constitutional: She is oriented to person, place, and time. She appears well-developed.  HENT:  Head: Normocephalic.  Eyes: Pupils are equal, round, and reactive to light.  Neck: Neck supple.  Neurological: She is alert and oriented to person, place, and time.  Skin: Skin  is warm and dry. Incision on dorsum of right foot redness around wound, looks infected, swelling, some drainage. Psychiatric: She has a normal mood and affect.  Symmetric normal motor tone is noted throughout. Normal muscle bulk. Muscle testing reveals 5/5 muscle strength of the upper extremity, and 5/5 of the lower extremity. Full range of motion in upper and lower extremities, except right wrist . ROM of spine is Restricted, kyphotic.Pain on light percussion of thoracic-lumbar spine, pain with extension of spine  Deformity of right wrist.  DTR in the upper and lower extremity are present and symmetric 2+. No clonus is noted.  Patient arises from chair with difficulty. Wide based gait with a cane, thoracic kyphosis ++ .        Assessment & Plan:  1.Increased pain at around T10-L1, pain on percussion, pain with extension, ordered X-ray of thoracic spine 2.Peripheral neuropathy  3.RA  4.DM  5. Surgical removal of right foot mass, incision 9 cm,which looked infected at her last visit, we made an appointment for her with Dr. Magnus Ivan, she had an infection in her surgical wound, she was put on Doxycycline, for 10 days, she saw Dr. Magnus Ivan again and he prescribed another round of Doxycycline for 10 days which she has finished a couple weeks ago. She did not follow up with Dr. Magnus Ivan, she states, that she did not want to follow up with them anymore.Wound still does not look good, signs of infection, referral to wound center placed today. I advised her to follow up with them, or go to another physician or to the ED if she notices increased redness, temperature, swelling yellowish drainage.  Patient has DM, and smokes.  Plan :  Continue with medications,patient refilled her Oxycodone.  Advised patient to cut down further on her smoking, she states, that she is down to half a pack per day now. Advised patient to rest and elevate her right foot, she can use cooling pads for further pain relief.  Follow  up in 1 month.

## 2013-02-18 ENCOUNTER — Telehealth: Payer: Self-pay | Admitting: Physical Medicine & Rehabilitation

## 2013-02-18 NOTE — Telephone Encounter (Signed)
Marylene Land, from [facility] in [facility], called to say pt NO SHOW to her appointment today... Marylene Land states she tried to call pt to reschedule, but all it does is ring... She was advised that a office note will be sent to clinical pool and her number if you all have questions or concerns are [phone], Angela.Marland KitchenMarland Kitchen

## 2013-02-18 NOTE — Telephone Encounter (Signed)
Please try to call patient and tell her she should follow up with wound care.

## 2013-03-10 ENCOUNTER — Telehealth: Payer: Self-pay | Admitting: Family Medicine

## 2013-03-10 NOTE — Telephone Encounter (Signed)
Call from emergency line.  Had ambulance come out today. Had an ulcer and had surgery on May 25th. The wound has not closed completely. Not satisfied with original doctor. Went to [place] [facility]. Boil that came up on leg above wound. Boil burst Friday. Looks like eating whole part of leg out. Ambulance driver said it was MRSA and the patient is afraid about this. Told by ambulance to get appointment tomorrow. Told that they would not likely do any cultures at the hospital. No fevers or chills. No nausea or vomiting. Has some pain in the limb.  Advised patient to call in the morning for an appointment. Advised that if she were to develop nausea, vomiting, fever, chills, worsening pain, or began feeling generally sick she should seek treatment tonight.  Marikay Alar, MD [facility]

## 2013-03-13 ENCOUNTER — Telehealth: Payer: Self-pay

## 2013-03-13 NOTE — Telephone Encounter (Signed)
Patient called to let us know she may not be able to make her appointment.  She has been told she has MRSA.  She would like to know what she needs to do but she needs her medication refilled.

## 2013-03-14 MED ORDER — OXYCODONE-ACETAMINOPHEN 10-325 MG PO TABS: 1.0000 | ORAL_TABLET | ORAL | Status: DC | PRN

## 2013-03-14 NOTE — Telephone Encounter (Signed)
Patient called and was informed we could give her her medication this month but she would need to follow up.  Rx printed.

## 2013-03-14 NOTE — Telephone Encounter (Signed)
We can give her her pain med for this month, she has to let it be picked up

## 2013-03-15 ENCOUNTER — Encounter: Payer: Medicaid Other | Admitting: Physical Medicine and Rehabilitation

## 2013-04-05 ENCOUNTER — Encounter: Payer: Self-pay | Admitting: Physical Medicine and Rehabilitation

## 2013-04-05 ENCOUNTER — Encounter
Payer: Medicaid Other | Attending: Physical Medicine and Rehabilitation | Admitting: Physical Medicine and Rehabilitation

## 2013-04-05 VITALS — BP 141/89 | HR 88 | Resp 14 | Ht 59.0 in | Wt 140.2 lb

## 2013-04-05 DIAGNOSIS — Z5181 Encounter for therapeutic drug level monitoring: Secondary | ICD-10-CM

## 2013-04-05 DIAGNOSIS — G4730 Sleep apnea, unspecified: Secondary | ICD-10-CM | POA: Insufficient documentation

## 2013-04-05 DIAGNOSIS — G8929 Other chronic pain: Secondary | ICD-10-CM | POA: Insufficient documentation

## 2013-04-05 DIAGNOSIS — J4489 Other specified chronic obstructive pulmonary disease: Secondary | ICD-10-CM | POA: Insufficient documentation

## 2013-04-05 DIAGNOSIS — S91301S Unspecified open wound, right foot, sequela: Secondary | ICD-10-CM

## 2013-04-05 DIAGNOSIS — L989 Disorder of the skin and subcutaneous tissue, unspecified: Secondary | ICD-10-CM | POA: Insufficient documentation

## 2013-04-05 DIAGNOSIS — E119 Type 2 diabetes mellitus without complications: Secondary | ICD-10-CM | POA: Insufficient documentation

## 2013-04-05 DIAGNOSIS — I2510 Atherosclerotic heart disease of native coronary artery without angina pectoris: Secondary | ICD-10-CM | POA: Insufficient documentation

## 2013-04-05 DIAGNOSIS — M810 Age-related osteoporosis without current pathological fracture: Secondary | ICD-10-CM | POA: Insufficient documentation

## 2013-04-05 DIAGNOSIS — M546 Pain in thoracic spine: Secondary | ICD-10-CM | POA: Insufficient documentation

## 2013-04-05 DIAGNOSIS — J449 Chronic obstructive pulmonary disease, unspecified: Secondary | ICD-10-CM | POA: Insufficient documentation

## 2013-04-05 DIAGNOSIS — I252 Old myocardial infarction: Secondary | ICD-10-CM | POA: Insufficient documentation

## 2013-04-05 DIAGNOSIS — Z79899 Other long term (current) drug therapy: Secondary | ICD-10-CM

## 2013-04-05 DIAGNOSIS — M069 Rheumatoid arthritis, unspecified: Secondary | ICD-10-CM | POA: Insufficient documentation

## 2013-04-05 DIAGNOSIS — G609 Hereditary and idiopathic neuropathy, unspecified: Secondary | ICD-10-CM | POA: Insufficient documentation

## 2013-04-05 DIAGNOSIS — F17200 Nicotine dependence, unspecified, uncomplicated: Secondary | ICD-10-CM | POA: Insufficient documentation

## 2013-04-05 DIAGNOSIS — E7800 Pure hypercholesterolemia, unspecified: Secondary | ICD-10-CM | POA: Insufficient documentation

## 2013-04-05 DIAGNOSIS — IMO0002 Reserved for concepts with insufficient information to code with codable children: Secondary | ICD-10-CM

## 2013-04-05 DIAGNOSIS — G894 Chronic pain syndrome: Secondary | ICD-10-CM

## 2013-04-05 MED ORDER — PROMETHAZINE HCL 25 MG PO TABS: 25.0000 mg | ORAL_TABLET | Freq: Every day | ORAL | Status: DC | PRN

## 2013-04-05 MED ORDER — OXYCODONE-ACETAMINOPHEN 10-325 MG PO TABS: 1.0000 | ORAL_TABLET | ORAL | Status: DC | PRN

## 2013-04-05 MED ORDER — PREGABALIN 25 MG PO CAPS: 75.0000 mg | ORAL_CAPSULE | Freq: Two times a day (BID) | ORAL | Status: DC

## 2013-04-05 NOTE — Patient Instructions (Signed)
Follow up with the wound care center, or PCP asap !

## 2013-04-05 NOTE — Progress Notes (Signed)
Subjective:    Patient ID: Beth [name], female    DOB: 15-Jun-1951, 62 y.o.   MRN: [id number]  HPI The patient complains about chronic back pain . The patient denies any radiation into her LEs.This problem has gotten worse, she complains about more severe pain.  The patient reports that her right foot mass was surgically removed by Dr. Magnus Ivan on 04/25/ 14. She states, that after the surgery she was in severe pain and took about 1 pill extra/ day. The patient reports that she had noticed some yellowish drainage, coming out of her incision at the visit 2 month ago, we made an appointment for her with Dr. Magnus Ivan, she had an infection in her surgical wound, she was put on Doxycycline, for 10 days. She states, that she does not want to follow up with them anymore, and she was not happy with the service she received at Dr. Eliberto Ivory office. At her last visit the wound still looked infected and we made an appointment with the wound center in [place] for her, she stated she followed up once, but she was not really clear on this, later she said she did not follow up. She developed a "boil", and called 911, the ambulance told her that the wound looked infected and that she should follow up with the wound center the next day, which she did not do.  The patient states that the numbness and tingling in her right foot has improved after the surgery.    Pain Inventory Average Pain 8 Pain Right Now 8 My pain is constant, sharp and aching  In the last 24 hours, has pain interfered with the following? General activity 9 Relation with others 9 Enjoyment of life 9 What TIME of day is your pain at its worst? morning and night Sleep (in general) Fair  Pain is worse with: some activites Pain improves with: medication Relief from Meds: 3  Mobility use a cane ability to climb steps?  no do you drive?  no  Function disabled: date disabled . I need assistance with the following:  bathing, meal  prep, household duties and shopping  Neuro/Psych trouble walking depression  Prior Studies Any changes since last visit?  no  Physicians involved in your care Any changes since last visit?  no   Family History  Problem Relation Age of Onset  . COPD Mother   . Heart disease Father   . Diabetes Sister   . COPD Brother   . Diabetes Sister   . Diabetes Brother    History   Social History  . Marital Status: Divorced    Spouse Name: N/A    Number of Children: N/A  . Years of Education: N/A   Social History Main Topics  . Smoking status: Current Every Day Smoker -- 1.00 packs/day for 37 years    Types: Cigarettes  . Smokeless tobacco: Never Used  . Alcohol Use: No  . Drug Use: No  . Sexual Activity: None   Other Topics Concern  . None   Social History Narrative  . None   Past Surgical History  Procedure Laterality Date  . Appendectomy    . Abdominal hysterectomy    . Stents  2007    5 cardiac stents  . Coronary angioplasty    . Breast surgery      age 66 left benign tumor removed   . Back surgery      kyphoplasty- 2010  . Mass excision Right 11/09/2012  Procedure: EXCISION RIGHT FOOT DORSAL MASS;  Surgeon: Kathryne Hitch, MD;  Location: [facility];  Service: Orthopedics;  Laterality: Right;  . Foot surgery Right 11-09-12   Past Medical History  Diagnosis Date  . Hypercholesterolemia   . Rheumatoid arthritis(714.0)   . Tobacco abuse   . COPD (chronic obstructive pulmonary disease)   . Bronchitis, chronic   . Diabetes mellitus   . Atrial fibrillation   . Osteoporosis   . Multiple URI   . Complication of anesthesia     with hysterectomy - " quit breathing" - no ICU   . Acute MI 2006    hx of 2 MI   . CAD (coronary artery disease)     hx of stents 2006   . Pneumonia     hx of   . Sleep apnea     cpap- does not use   . Anemia     hx of   . Hepatitis     at age 37    BP 141/89  Pulse 88  Resp 14  Ht 4\' 11"  (1.499 m)  Wt 140 lb 3.2 oz  (63.594 kg)  BMI 28.3 kg/m2  SpO2 95%   Review of Systems  Respiratory: Positive for apnea.   Gastrointestinal: Positive for nausea and vomiting.  Musculoskeletal: Positive for back pain and gait problem.  Psychiatric/Behavioral: Positive for dysphoric mood.  All other systems reviewed and are negative.       Objective:   Physical Exam Constitutional: She is oriented to person, place, and time. She appears well-developed.  HENT:  Head: Normocephalic.  Eyes: Pupils are equal, round, and reactive to light.  Neck: Neck supple.  Neurological: She is alert and oriented to person, place, and time.  Skin: Skin is warm and dry. Incision on dorsum of right foot redness around wound, looks infected, swelling, some drainage. Psychiatric: She has a normal mood and affect.  Symmetric normal motor tone is noted throughout. Normal muscle bulk. Muscle testing reveals 5/5 muscle strength of the upper extremity, and 5/5 of the lower extremity. Full range of motion in upper and lower extremities, except right wrist . ROM of spine is Restricted, kyphotic.Pain on light percussion of thoracic-lumbar spine, pain with extension of spine  Deformity of right wrist.  DTR in the upper and lower extremity are present and symmetric 2+. No clonus is noted.  Patient arises from chair with difficulty. Wide based gait with a cane, thoracic kyphosis ++ .        Assessment & Plan:  1.Increased pain at around T10-L1, pain on percussion, pain with extension, ordered X-ray of thoracic spine  2.Peripheral neuropathy  3.RA  4.DM  5. Surgical removal of right foot mass, incision 9 cm,which looked infected at her last visit, we made an appointment for her with Dr. Magnus Ivan, she had an infection in her surgical wound, she was put on Doxycycline, for 10 days, she saw Dr. Magnus Ivan again and he prescribed another round of Doxycycline for 10 days which she has finished a couple weeks ago. She did not follow up with Dr.  Magnus Ivan, she states, that she did not want to follow up with them anymore.She also did not follow up with the wound center after we set her up for an appointment.Wound still does not look good, signs of infection, referral to wound center placed today AGAIN. Strongly advised patient to follow up this time, educated her about potential bad outcomes , like having her foot amputated in the  worse case, or even get a systemic infection. Her daughter was present, and stated that she will try to get her into treatment for this wound, she also stated that her mother can be very stubborn.    Patient has DM, and smokes.  Plan :  Continue with medications,patient refilled her Oxycodone.  Advised patient to cut down further on her smoking, she states, that she is down to half a pack per day now. Advised patient strongly several times to follow up with wound care or her PCP, repeadetly informed her about potential severe outcomes.  Follow up in 1 month.

## 2013-04-14 ENCOUNTER — Telehealth: Payer: Self-pay | Admitting: Family Medicine

## 2013-04-14 NOTE — Telephone Encounter (Signed)
Patient called concerned about a "boil"  On her right ankle that is extremely painful. She was recently seen by the wound care clinic in [facility] for this problem and instructed to pack it. She is unsure of what to do. I explained that I cannot tell what to do over the phone since I have not seen the lesion, but I instructed to go to the ED at [facility] if her pain was too strong for home BP meds or if she feels ill with fever or chills. She agreed to this plan.

## 2013-04-24 ENCOUNTER — Telehealth: Payer: Self-pay | Admitting: Physical Medicine and Rehabilitation

## 2013-04-24 NOTE — Telephone Encounter (Signed)
The [facility] in [place] has rx. Needs PA

## 2013-04-24 NOTE — Telephone Encounter (Signed)
Patient stating that her pharmacy is telling her that she does not have a refill on her Lyrica and she is needing it.  Please call patient.

## 2013-05-07 ENCOUNTER — Inpatient Hospital Stay: Payer: Medicaid Other | Admitting: Family Medicine

## 2013-05-09 ENCOUNTER — Telehealth: Payer: Self-pay | Admitting: Family Medicine

## 2013-05-09 NOTE — Telephone Encounter (Signed)
Pt was discharged from [place] [facility] about 2 weeks ago with IV levaquin and levaquin pills and metropolo This has been dropping her blood pressure. Pt has not been feeling well Please advise

## 2013-05-10 ENCOUNTER — Encounter
Payer: Medicaid Other | Attending: Physical Medicine and Rehabilitation | Admitting: Physical Medicine and Rehabilitation

## 2013-05-10 ENCOUNTER — Inpatient Hospital Stay: Payer: Medicaid Other | Admitting: Family Medicine

## 2013-05-10 ENCOUNTER — Encounter: Payer: Self-pay | Admitting: Physical Medicine and Rehabilitation

## 2013-05-10 VITALS — BP 126/70 | HR 89 | Resp 14 | Ht 59.0 in | Wt 136.0 lb

## 2013-05-10 DIAGNOSIS — L988 Other specified disorders of the skin and subcutaneous tissue: Secondary | ICD-10-CM | POA: Insufficient documentation

## 2013-05-10 DIAGNOSIS — F17200 Nicotine dependence, unspecified, uncomplicated: Secondary | ICD-10-CM | POA: Insufficient documentation

## 2013-05-10 DIAGNOSIS — E119 Type 2 diabetes mellitus without complications: Secondary | ICD-10-CM | POA: Insufficient documentation

## 2013-05-10 DIAGNOSIS — M545 Low back pain, unspecified: Secondary | ICD-10-CM | POA: Insufficient documentation

## 2013-05-10 DIAGNOSIS — G609 Hereditary and idiopathic neuropathy, unspecified: Secondary | ICD-10-CM | POA: Insufficient documentation

## 2013-05-10 DIAGNOSIS — E1142 Type 2 diabetes mellitus with diabetic polyneuropathy: Secondary | ICD-10-CM

## 2013-05-10 DIAGNOSIS — M069 Rheumatoid arthritis, unspecified: Secondary | ICD-10-CM | POA: Insufficient documentation

## 2013-05-10 DIAGNOSIS — M546 Pain in thoracic spine: Secondary | ICD-10-CM | POA: Insufficient documentation

## 2013-05-10 DIAGNOSIS — G8929 Other chronic pain: Secondary | ICD-10-CM | POA: Insufficient documentation

## 2013-05-10 DIAGNOSIS — E1149 Type 2 diabetes mellitus with other diabetic neurological complication: Secondary | ICD-10-CM

## 2013-05-10 DIAGNOSIS — Z8619 Personal history of other infectious and parasitic diseases: Secondary | ICD-10-CM | POA: Insufficient documentation

## 2013-05-10 DIAGNOSIS — G894 Chronic pain syndrome: Secondary | ICD-10-CM

## 2013-05-10 MED ORDER — OXYCODONE-ACETAMINOPHEN 10-325 MG PO TABS: 1.0000 | ORAL_TABLET | ORAL | Status: DC | PRN

## 2013-05-10 NOTE — Patient Instructions (Signed)
Rest your leg

## 2013-05-10 NOTE — Telephone Encounter (Signed)
Pt had OV this am.  Radene Ou, CMA

## 2013-05-10 NOTE — Progress Notes (Signed)
Subjective:    Patient ID: [name], female    DOB: 1951/02/11, 62 y.o.   MRN: [id number]  HPI The patient complains about chronic back pain . The patient denies any radiation into her LEs.This problem has gotten worse, she complains about more severe pain.  The patient reports that her right foot mass was surgically removed by Dr. Magnus Ivan on 04/25/ 14. She states, that after the surgery she was in severe pain and took about 1 pill extra/ day. The patient reports that she had noticed some yellowish drainage, coming out of her incision at the visit 2 month ago, we made an appointment for her with Dr. Magnus Ivan, she had an infection in her surgical wound, she was put on Doxycycline, for 10 days. She states, that she does not want to follow up with them anymore, and she was not happy with the service she received at Dr. Eliberto Ivory office. At her last visit the wound still looked infected and we made an appointment with the wound center in [place] for her, she stated she followed up once, but she was not really clear on this, later she said she did not follow up. She developed a "boil", and called 911, the ambulance told her that the wound looked infected and that she should follow up with the wound center the next day, which she did not do.   After the last visit,she finally followed up with wound care, she was on IV levaquin and oral levaquin, per patient,for 2 weeks.She is still following up with wound care at [facility].  Pain Inventory Average Pain 9 Pain Right Now 9 My pain is stabbing, tingling and aching  In the last 24 hours, has pain interfered with the following? General activity 9 Relation with others 9 Enjoyment of life 9 What TIME of day is your pain at its worst? morning, night Sleep (in general) Fair  Pain is worse with: some activites Pain improves with: medication Relief from Meds: 3  Mobility walk with assistance use a cane ability to climb steps?   no do you drive?  no use a wheelchair  Function disabled: date disabled na I need assistance with the following:  bathing, meal prep, household duties and shopping  Neuro/Psych trouble walking depression  Prior Studies Any changes since last visit?  no  Physicians involved in your care Any changes since last visit?  no   Family History  Problem Relation Age of Onset  . COPD Mother   . Heart disease Father   . Diabetes Sister   . COPD Brother   . Diabetes Sister   . Diabetes Brother    History   Social History  . Marital Status: Divorced    Spouse Name: N/A    Number of Children: N/A  . Years of Education: N/A   Social History Main Topics  . Smoking status: Current Every Day Smoker -- 1.00 packs/day for 37 years    Types: Cigarettes  . Smokeless tobacco: Never Used  . Alcohol Use: No  . Drug Use: No  . Sexual Activity: None   Other Topics Concern  . None   Social History Narrative  . None   Past Surgical History  Procedure Laterality Date  . Appendectomy    . Abdominal hysterectomy    . Stents  2007    5 cardiac stents  . Coronary angioplasty    . Breast surgery      age 40 left benign tumor removed   .  Back surgery      kyphoplasty- 2010  . Mass excision Right 11/09/2012    Procedure: EXCISION RIGHT FOOT DORSAL MASS;  Surgeon: Kathryne Hitch, MD;  Location: [facility];  Service: Orthopedics;  Laterality: Right;  . Foot surgery Right 11-09-12   Past Medical History  Diagnosis Date  . Hypercholesterolemia   . Rheumatoid arthritis(714.0)   . Tobacco abuse   . COPD (chronic obstructive pulmonary disease)   . Bronchitis, chronic   . Diabetes mellitus   . Atrial fibrillation   . Osteoporosis   . Multiple URI   . Complication of anesthesia     with hysterectomy - " quit breathing" - no ICU   . Acute MI 2006    hx of 2 MI   . CAD (coronary artery disease)     hx of stents 2006   . Pneumonia     hx of   . Sleep apnea     cpap- does not use    . Anemia     hx of   . Hepatitis     at age 42    BP 126/70  Pulse 89  Resp 14  Ht 4\' 11"  (1.499 m)  Wt 136 lb (61.689 kg)  BMI 27.45 kg/m2  SpO2 99%     Review of Systems  Musculoskeletal: Positive for gait problem.  Psychiatric/Behavioral: Positive for dysphoric mood.  All other systems reviewed and are negative.       Objective:   Physical Exam Constitutional: She is oriented to person, place, and time. She appears well-developed.  HENT:  Head: Normocephalic.  Eyes: Pupils are equal, round, and reactive to light.  Neck: Neck supple.  Neurological: She is alert and oriented to person, place, and time.  Skin: Skin is warm and dry. Incision on dorsum of right foot, covered, no redness or swelling noted around . Psychiatric: She has a normal mood and affect.  Symmetric normal motor tone is noted throughout. Normal muscle bulk. Muscle testing reveals 5/5 muscle strength of the upper extremity, and 5/5 of the lower extremity. Full range of motion in upper and lower extremities, except right wrist . ROM of spine is Restricted, kyphotic.Pain on light percussion of thoracic-lumbar spine, pain with extension of spine  Deformity of right wrist.  DTR in the upper and lower extremity are present and symmetric 2+. No clonus is noted.  Patient in wheel chair today        Assessment & Plan:  1.Increased pain at around T10-L1, pain on percussion, pain with extension, ordered X-ray of thoracic spine  2.Peripheral neuropathy  3.RA  4.DM  5. Surgical removal of right foot mass, incision 9 cm,which looked infected at her last visit, we made an appointment for her with Dr. Magnus Ivan, she had an infection in her surgical wound, she was put on Doxycycline, for 10 days, she saw Dr. Magnus Ivan again and he prescribed another round of Doxycycline for 10 days which she had finished . She did not follow up with Dr. Magnus Ivan, she states, that she did not want to follow up with them anymore.She  also did not follow up with the wound center after we set her up for an appointment 2 month ago.   Patient has DM, and smokes.  Plan :   Advised patient strongly several times to follow up with wound care or her PCP, repeadetly informed her about potential severe outcomes at the last visit, she finally followed up with wound care, she was  on IV levaquin and oral levaquin, per patient,for 2 weeks.She is still following up with wound care at [facility].  Continue with medications,patient refilled her Oxycodone.  Advised patient to cut down further on her smoking, she states, that she is down to half a pack per day now.  Follow up in 1 month.

## 2013-05-11 ENCOUNTER — Other Ambulatory Visit: Payer: Self-pay | Admitting: Family Medicine

## 2013-05-11 MED ORDER — NYSTATIN 100000 UNIT/ML MT SUSP: 500000.0000 [IU] | Freq: Four times a day (QID) | OROMUCOSAL | Status: DC

## 2013-05-11 NOTE — Telephone Encounter (Signed)
Patient called Emergency line complaining of thrush.  She reports that she has been on IV abx and has now developed thrush which is making it difficult for her to place her dentures, eat, etc. Will send in Rx for Nystatin.

## 2013-05-14 ENCOUNTER — Ambulatory Visit (INDEPENDENT_AMBULATORY_CARE_PROVIDER_SITE_OTHER): Payer: Medicaid Other | Admitting: Family Medicine

## 2013-05-14 ENCOUNTER — Encounter: Payer: Self-pay | Admitting: Family Medicine

## 2013-05-14 VITALS — BP 101/60 | HR 85 | Ht 59.0 in | Wt 134.6 lb

## 2013-05-14 DIAGNOSIS — E1149 Type 2 diabetes mellitus with other diabetic neurological complication: Secondary | ICD-10-CM

## 2013-05-14 DIAGNOSIS — I2510 Atherosclerotic heart disease of native coronary artery without angina pectoris: Secondary | ICD-10-CM

## 2013-05-14 DIAGNOSIS — E1142 Type 2 diabetes mellitus with diabetic polyneuropathy: Secondary | ICD-10-CM

## 2013-05-14 DIAGNOSIS — E1165 Type 2 diabetes mellitus with hyperglycemia: Secondary | ICD-10-CM

## 2013-05-14 LAB — BASIC METABOLIC PANEL
CO2: 26 mEq/L (ref 19–32)
Chloride: 103 mEq/L (ref 96–112)
Creat: 0.88 mg/dL (ref 0.50–1.10)
Glucose, Bld: 139 mg/dL — ABNORMAL HIGH (ref 70–99)

## 2013-05-14 LAB — POCT GLYCOSYLATED HEMOGLOBIN (HGB A1C): Hemoglobin A1C: 7.5

## 2013-05-14 MED ORDER — PRODIGY BLOOD GLUCOSE MONITOR DEVI: 1.0000 | Freq: Every day | Status: AC

## 2013-05-14 MED ORDER — INSULIN GLARGINE 100 UNIT/ML ~~LOC~~ SOLN: 25.0000 [IU] | Freq: Every day | SUBCUTANEOUS | Status: AC

## 2013-05-14 NOTE — Progress Notes (Signed)
Subjective:     Patient ID: [name], female   DOB: 1950/09/11, 62 y.o.   MRN: [id number] Attending Note; Patient seen and examined by me in conjunction with medical student Bobette Mo, [facility] MS3, and I agree with her documentation and plan.  My additions in bold type. JB HPI [name] presents for hospital f/u after hospitalization 2 weeks ago for wound infection of right foot.   Wound of R foot: Patient had mass excised from R foot in May 2014. Wound did not heal well, initially evaluated several times at [facility] 2 mos ago for drainage and subsequent boils until they referred her to ED at [facility] for large lesion. Wound was drained and IV abx given for 1 hour. Patient was called a week later with culture results and told she needed different abx to cover infection. She believes infection was Pseudomonas aeruginosa. Per patient's report given IV Levaquin and oral Levaquin for 4 days at [facility] and then sent home with PIV, IV and oral Levaquin for 2 weeks. Patient has a hx of DM, A1C today 7.5. She denies fever, chills. Reports wound is much improved now, not draining, decreased erythema, decreased pain.  Blood pressure: Also concerned that she was started on metoprolol for BP during hospital stay. Reports that BP was too low (90s/50s) so she has stopped taking it. BP today 101/60. Endorses occasional chest pain. 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  Her daughter [name] is present for this visit as well. Had been seen in their ED initially, had procedural intervention (?I&D, debridement?) and was later called back to be admitted based upon (?wound) culture results that indicated a need for intravenous antibiotics.  She was discharged with a peripheral IV for home-administration of antibiotics and oral levaquin for 2 weeks, which she completed last  Friday (October 24).  She has remained afebrile and feels relatively well since.  She has been followed by [facility] before this hospitalization; she lives in [name] (previously lived in [place]).   Of note, patient has a history of MI approximately 7 yrs ago and has been followed by Dr. Nanetta Batty as her cardiologist.  She was started on metoprolol while in [facility], and she reports that she developed some symptomatic hypotension while on the BB.    Social Hx; She continues to smoke cigarettes,is interested in quitting.  Has tried with Chantix in the past but did not find helpful.  Plans to try nicotine patch. JB  Review of Systems Per HPI.    Objective:   Physical Exam General: alert, oriented elderly female in NAD.  Cardio: RRR, no murmurs appreciated Chest/lungs: CTAB, no wheezes or rales Extremities: Healing wounds on dorsum of right foot and ankle. Mild erythema noted. No edema or drainage. Psych: PHQ-9 score 6 today.  Exam; Well appearing, no apparent distress.  HEENT Neck supple, no cervical adenopathy.  COR regular S1S2 PULM Clear bilaterally, no rales or wheezes EXTS; R foot unwrapped; small area (1x1cm) open area of skin covered in Aquaphor bandage, without surrounding erythema, fluctuance or purulence.  Palpable dp pulses bilaterally. No other lesions noted in feet, no ankle edema.  Brisk capillary refill. Right foot hypersensitive to light touch (bordering on allodynia). JB    Assessment:  Wound appears to be healing well, DM controlled.  Plan:   1. Wound  of R foot -Patient will follow-up with [place] wound care prn -Continued wound care with Aquaphor and gauze  2. Hypotension -Continue Lisinopril 2.5 -Stop metoprolol for now but consider low dose beta blocker for cardiac protection given hx of MI -BMP today for renal fxn -See Dr. Ermalinda Memos for f/u -Patient to make appt with cardiologist, Dr. Allyson Sabal

## 2013-05-14 NOTE — Patient Instructions (Addendum)
It was a pleasure to meet you today!   Please continue your wound care as we discussed. Today we are getting a release of information from you for your recent hospital stay at [facility]. We also are sending prescription for Lantus, nitrogylcerin, and a glucometer.   We are checking your kidney function today and will follow-up with you once we have the results.   Please follow-up with Dr. Ermalinda Memos. Also, please schedule an appointment with your cardiologist, Dr. Allyson Sabal.

## 2013-05-15 NOTE — Assessment & Plan Note (Signed)
Patient with reported prior MI (she reports this 7 years ago), which is likely the reason for her being started on BB during recent hospital stay.  She has had hypotensive episodes since this med change; will opt to hold for now, with plan to reinstate at even lower dose (metoprolol 12.5 twice daily) at subsequent visit.  She plans to see her cardiologist, Dr Allyson Sabal, in the near future as well. JB

## 2013-05-15 NOTE — Assessment & Plan Note (Signed)
Patient seen today for right foot DM foot ulcer, treated as inpatient at [facility] with IV antibiotics and then 2-week outpatient course.  She has done quite well; no fevers or signs of systemic illness.  To continue her daily application of Aquaphor and Kerlix wrapping; daily foot checks.  She plans to see the [facility], where she has been seen previously.  We are requesting a Release of Information to get the Discharge Summary from her recent hospitalization (to include culture results and abx used). Her DM control is much improved from her previous A1C value in our system, possibly reflecting the treatment of her soft tissue infection.  Refills of Lantus, as well as another Glucometer (hers was lost in the move from [place] to [place]). JB

## 2013-05-16 ENCOUNTER — Encounter: Payer: Self-pay | Admitting: Family Medicine

## 2013-05-27 ENCOUNTER — Ambulatory Visit: Payer: Medicaid Other | Admitting: Family Medicine

## 2013-06-07 ENCOUNTER — Encounter
Payer: Medicaid Other | Attending: Physical Medicine and Rehabilitation | Admitting: Physical Medicine and Rehabilitation

## 2013-06-07 ENCOUNTER — Encounter: Payer: Self-pay | Admitting: Physical Medicine and Rehabilitation

## 2013-06-07 VITALS — BP 129/79 | HR 108 | Resp 14 | Ht 59.0 in | Wt 138.0 lb

## 2013-06-07 DIAGNOSIS — M545 Low back pain, unspecified: Secondary | ICD-10-CM | POA: Insufficient documentation

## 2013-06-07 DIAGNOSIS — E1149 Type 2 diabetes mellitus with other diabetic neurological complication: Secondary | ICD-10-CM

## 2013-06-07 DIAGNOSIS — F17200 Nicotine dependence, unspecified, uncomplicated: Secondary | ICD-10-CM | POA: Insufficient documentation

## 2013-06-07 DIAGNOSIS — E119 Type 2 diabetes mellitus without complications: Secondary | ICD-10-CM | POA: Insufficient documentation

## 2013-06-07 DIAGNOSIS — G894 Chronic pain syndrome: Secondary | ICD-10-CM

## 2013-06-07 DIAGNOSIS — G609 Hereditary and idiopathic neuropathy, unspecified: Secondary | ICD-10-CM | POA: Insufficient documentation

## 2013-06-07 DIAGNOSIS — E1142 Type 2 diabetes mellitus with diabetic polyneuropathy: Secondary | ICD-10-CM

## 2013-06-07 DIAGNOSIS — M069 Rheumatoid arthritis, unspecified: Secondary | ICD-10-CM | POA: Insufficient documentation

## 2013-06-07 MED ORDER — OXYCODONE-ACETAMINOPHEN 10-325 MG PO TABS: 1.0000 | ORAL_TABLET | ORAL | Status: DC | PRN

## 2013-06-07 MED ORDER — PREGABALIN 25 MG PO CAPS: 75.0000 mg | ORAL_CAPSULE | Freq: Two times a day (BID) | ORAL | Status: DC

## 2013-06-07 MED ORDER — PROMETHAZINE HCL 25 MG PO TABS: 25.0000 mg | ORAL_TABLET | Freq: Every day | ORAL | Status: DC | PRN

## 2013-06-07 NOTE — Progress Notes (Signed)
Subjective:    Patient ID: [name], female    DOB: [birth year], 62 y.o.   MRN: [id number]  HPI The patient complains about chronic back pain . The patient denies any radiation into her LEs.This problem has gotten worse, she complains about more severe pain.   Surgical removal of right foot mass, incision 9 cm,which looked infected at her last visit, we made an appointment for her with Dr. Magnus Ivan, she had an infection in her surgical wound, she was put on Doxycycline, for 10 days, she saw Dr. Magnus Ivan again and he prescribed another round of Doxycycline for 10 days which she finished .She then developed another infection, and did not follow up with Dr. Magnus Ivan anymore. Advised patient strongly several times to follow up with wound care or her PCP, repeadetly informed her about potential severe outcomes at the last visit, she finally followed up with wound care, she was on IV levaquin and oral levaquin, per patient. She also states that her incision has finally healed.  Pain Inventory Average Pain 8 Pain Right Now 8 My pain is sharp and aching  In the last 24 hours, has pain interfered with the following? General activity 9 Relation with others 9 Enjoyment of life 9 What TIME of day is your pain at its worst? all Sleep (in general) Poor  Pain is worse with: walking, bending, sitting, standing and some activites Pain improves with: rest and medication Relief from Meds: 3  Mobility use a cane  Function disabled: date disabled . I need assistance with the following:  meal prep, household duties and shopping  Neuro/Psych trouble walking depression  Prior Studies Any changes since last visit?  no  Physicians involved in your care Any changes since last visit?  no   Family History  Problem Relation Age of Onset  . COPD Mother   . Heart disease Father   . Diabetes Sister   . COPD Brother   . Diabetes Sister   . Diabetes Brother    History   Social History  .  Marital Status: Divorced    Spouse Name: N/A    Number of Children: N/A  . Years of Education: N/A   Social History Main Topics  . Smoking status: Current Every Day Smoker -- 1.00 packs/day for 37 years    Types: Cigarettes  . Smokeless tobacco: Never Used  . Alcohol Use: No  . Drug Use: No  . Sexual Activity: None   Other Topics Concern  . None   Social History Narrative  . None   Past Surgical History  Procedure Laterality Date  . Appendectomy    . Abdominal hysterectomy    . Stents  2007    5 cardiac stents  . Coronary angioplasty    . Breast surgery      age 43 left benign tumor removed   . Back surgery      kyphoplasty- 2010  . Mass excision Right 11/09/2012    Procedure: EXCISION RIGHT FOOT DORSAL MASS;  Surgeon: Kathryne Hitch, MD;  Location: [facility];  Service: Orthopedics;  Laterality: Right;  . Foot surgery Right 11-09-12   Past Medical History  Diagnosis Date  . Hypercholesterolemia   . Rheumatoid arthritis(714.0)   . Tobacco abuse   . COPD (chronic obstructive pulmonary disease)   . Bronchitis, chronic   . Diabetes mellitus   . Atrial fibrillation   . Osteoporosis   . Multiple URI   . Complication of anesthesia  with hysterectomy - " quit breathing" - no ICU   . Acute MI 2006    hx of 2 MI   . CAD (coronary artery disease)     hx of stents 2006   . Pneumonia     hx of   . Sleep apnea     cpap- does not use   . Anemia     hx of   . Hepatitis     at age 71    BP 129/79  Pulse 108  Resp 14  Ht 4\' 11"  (1.499 m)  Wt 138 lb (62.596 kg)  BMI 27.86 kg/m2  SpO2 97%    Review of Systems  Musculoskeletal: Positive for gait problem.  Psychiatric/Behavioral: Positive for dysphoric mood.  All other systems reviewed and are negative.       Objective:   Physical Exam Constitutional: She is oriented to person, place, and time. She appears well-developed.  HENT:  Head: Normocephalic.  Eyes: Pupils are equal, round, and reactive to  light.  Neck: Neck supple.  Neurological: She is alert and oriented to person, place, and time.  Skin: Skin is warm and dry. Incision on dorsum of right foot, has healed well, but 4mm in diameter wound at anterior lower leg, just above her malleoli, looks infected, mild redness around, and oozing yellowish drainage.covered, no redness or swelling noted around . Psychiatric: She has a normal mood and affect.  Symmetric normal motor tone is noted throughout. Normal muscle bulk. Muscle testing reveals 5/5 muscle strength of the upper extremity, and 5/5 of the lower extremity. Full range of motion in upper and lower extremities, except right wrist . ROM of spine is Restricted, kyphotic. Deformity of right wrist.  DTR in the upper and lower extremity are present and symmetric 2+. No clonus is noted.  Patient in wheel chair today        Assessment & Plan:  1. LBP, no radiating Sx , stable at this point 2.Peripheral neuropathy  3.RA  4.DM  5. Surgical removal of right foot mass, incision 9 cm,which looked infected at her last visit, we made an appointment for her with Dr. Magnus Ivan, she had an infection in her surgical wound, she was put on Doxycycline, for 10 days, she saw Dr. Magnus Ivan again and he prescribed another round of Doxycycline for 10 days which she finished .She then developed another infection, and did not follow up with Dr. Magnus Ivan anymore. Advised patient strongly several times to follow up with wound care or her PCP, repeadetly informed her about potential severe outcomes at the last visit, she finally followed up with wound care, she was on IV levaquin and oral levaquin, per patient,for 2 weeks, the surgical  incision has healed well, but she still has a small 4mm in diameter wound on her anterior lower leg just above the malleoli.Advised patient strongly again, several times to follow up with wound care or the home health nurse who took care of her wound before. Patient has DM, and  smokes.    Continue with medications,patient refilled her Oxycodone.  Advised patient to cut down further on her smoking, she states, that she is down to half a pack per day now.  Follow up in 1 month.

## 2013-06-07 NOTE — Patient Instructions (Signed)
I strongly advise you to follow up with wound care for your small open wound, and also call the nurse who took care of your other wound in the past !!!

## 2013-06-28 ENCOUNTER — Other Ambulatory Visit: Payer: Self-pay

## 2013-06-28 MED ORDER — PREGABALIN 25 MG PO CAPS: 75.0000 mg | ORAL_CAPSULE | Freq: Two times a day (BID) | ORAL | Status: DC

## 2013-07-02 ENCOUNTER — Other Ambulatory Visit: Payer: Self-pay | Admitting: *Deleted

## 2013-07-02 MED ORDER — OXYCODONE-ACETAMINOPHEN 10-325 MG PO TABS: 1.0000 | ORAL_TABLET | ORAL | Status: DC | PRN

## 2013-07-02 NOTE — Telephone Encounter (Signed)
RX printed early for controlled medication for the visit with RN on 07/05/13 (to be signed by MD) 

## 2013-07-13 ENCOUNTER — Telehealth: Payer: Self-pay | Admitting: Family Medicine

## 2013-07-13 NOTE — Telephone Encounter (Signed)
Pt called reporting that she has a "bad urinary tract infection" and asks if I can "call something in." She states her last UTI was about a month ago and treated at [facility]. I stated that our policy is that antibiotics are not prescribed over the phone without MD evaluation first. She was instructed to go to urgent care. Pt voiced understanding.

## 2013-07-16 ENCOUNTER — Telehealth: Payer: Self-pay | Admitting: Physical Medicine & Rehabilitation

## 2013-07-16 NOTE — Telephone Encounter (Signed)
FYI:  Left message for patient to call back and speak with me about referral.  Su Monks made referral to wound care center in [facility] in November.  Spoke with Judeth Cornfield at [facility] yesterday [phone]).  Patient had been referred there earlier in the year, but had not followed up for her appointments.  In fact, patient had requested referral from them to be seen in ER for wound care issues.  If she wants to go to the [facility], she will need to have her PCP refer her there, by phone, because she has [employer].  She has seen Dr. Magnus Ivan within [facility] in the past.

## 2013-07-29 ENCOUNTER — Other Ambulatory Visit: Payer: Self-pay | Admitting: *Deleted

## 2013-07-29 MED ORDER — PREGABALIN 25 MG PO CAPS: 75.0000 mg | ORAL_CAPSULE | Freq: Two times a day (BID) | ORAL | Status: DC

## 2013-07-29 MED ORDER — OXYCODONE-ACETAMINOPHEN 10-325 MG PO TABS: 1.0000 | ORAL_TABLET | ORAL | Status: DC | PRN

## 2013-07-29 NOTE — Telephone Encounter (Signed)
RX printed early for controlled medication for the visit with RN on 07/31/13 (to be signed by MD) 

## 2013-07-31 ENCOUNTER — Encounter: Payer: Self-pay | Admitting: *Deleted

## 2013-07-31 ENCOUNTER — Encounter: Payer: Medicaid Other | Attending: Physical Medicine & Rehabilitation | Admitting: *Deleted

## 2013-07-31 VITALS — BP 156/75 | HR 83 | Resp 14

## 2013-07-31 DIAGNOSIS — M25579 Pain in unspecified ankle and joints of unspecified foot: Secondary | ICD-10-CM | POA: Insufficient documentation

## 2013-07-31 DIAGNOSIS — M25529 Pain in unspecified elbow: Secondary | ICD-10-CM | POA: Insufficient documentation

## 2013-07-31 DIAGNOSIS — Z79899 Other long term (current) drug therapy: Secondary | ICD-10-CM | POA: Insufficient documentation

## 2013-07-31 DIAGNOSIS — M549 Dorsalgia, unspecified: Secondary | ICD-10-CM | POA: Insufficient documentation

## 2013-07-31 DIAGNOSIS — G8929 Other chronic pain: Secondary | ICD-10-CM | POA: Insufficient documentation

## 2013-07-31 NOTE — Patient Instructions (Signed)
Follow up one month with DR Wynn Banker (has appt 08/30/13)  Make sure you only take percocet ast prescribed and not more than 5 pills per day!

## 2013-07-31 NOTE — Progress Notes (Signed)
Here for pill count and medication refills. Oxycodone 10/325  Fill date 07/05/13     Today NV#0  This is about 3 days early.  I talked with her about this and to be very careful not to take more than the prescribed amount.    VSS    Pain level:8  She thinks she has a UTI.  She is to contact her PCP about this or go by .  Return to see Dr Wynn Banker  08/30/12

## 2013-08-16 ENCOUNTER — Telehealth: Payer: Self-pay | Admitting: Family Medicine

## 2013-08-16 NOTE — Telephone Encounter (Signed)
Pt has a UTI and would like to know if the doctor can call in anything for her. If not she made an appointment for 2/2.  She is using the [address] on [address]. jw

## 2013-08-16 NOTE — Telephone Encounter (Signed)
Pt told antibiotics are not prescribed over the phone without MD evaluation done first.  Pt verbalized understanding and will keep appt 08/19/2013.  Told pt if symptoms became unbearable or got worse, to go to Urgent Care.  Cristie Mckinney, Darlyne Russian, CMA

## 2013-08-19 ENCOUNTER — Ambulatory Visit: Payer: Medicaid Other | Admitting: Family Medicine

## 2013-08-30 ENCOUNTER — Encounter: Payer: Self-pay | Admitting: Physical Medicine & Rehabilitation

## 2013-08-30 ENCOUNTER — Encounter: Payer: Medicaid Other | Attending: Physical Medicine and Rehabilitation

## 2013-08-30 ENCOUNTER — Encounter: Payer: Self-pay | Admitting: Family Medicine

## 2013-08-30 ENCOUNTER — Ambulatory Visit (INDEPENDENT_AMBULATORY_CARE_PROVIDER_SITE_OTHER): Payer: Medicaid Other | Admitting: Family Medicine

## 2013-08-30 ENCOUNTER — Ambulatory Visit (HOSPITAL_BASED_OUTPATIENT_CLINIC_OR_DEPARTMENT_OTHER): Payer: Medicaid Other | Admitting: Physical Medicine & Rehabilitation

## 2013-08-30 VITALS — BP 140/80 | HR 90 | Temp 97.2°F

## 2013-08-30 VITALS — BP 146/82 | HR 91 | Resp 16 | Ht 59.0 in | Wt 137.2 lb

## 2013-08-30 DIAGNOSIS — M069 Rheumatoid arthritis, unspecified: Secondary | ICD-10-CM | POA: Insufficient documentation

## 2013-08-30 DIAGNOSIS — M545 Low back pain, unspecified: Secondary | ICD-10-CM | POA: Insufficient documentation

## 2013-08-30 DIAGNOSIS — G8929 Other chronic pain: Secondary | ICD-10-CM

## 2013-08-30 DIAGNOSIS — F17200 Nicotine dependence, unspecified, uncomplicated: Secondary | ICD-10-CM | POA: Insufficient documentation

## 2013-08-30 DIAGNOSIS — M25579 Pain in unspecified ankle and joints of unspecified foot: Secondary | ICD-10-CM

## 2013-08-30 DIAGNOSIS — E119 Type 2 diabetes mellitus without complications: Secondary | ICD-10-CM | POA: Insufficient documentation

## 2013-08-30 DIAGNOSIS — G609 Hereditary and idiopathic neuropathy, unspecified: Secondary | ICD-10-CM | POA: Insufficient documentation

## 2013-08-30 DIAGNOSIS — E1149 Type 2 diabetes mellitus with other diabetic neurological complication: Secondary | ICD-10-CM

## 2013-08-30 DIAGNOSIS — E1142 Type 2 diabetes mellitus with diabetic polyneuropathy: Secondary | ICD-10-CM

## 2013-08-30 DIAGNOSIS — M549 Dorsalgia, unspecified: Secondary | ICD-10-CM

## 2013-08-30 DIAGNOSIS — R300 Dysuria: Secondary | ICD-10-CM

## 2013-08-30 DIAGNOSIS — I10 Essential (primary) hypertension: Secondary | ICD-10-CM

## 2013-08-30 DIAGNOSIS — N12 Tubulo-interstitial nephritis, not specified as acute or chronic: Secondary | ICD-10-CM

## 2013-08-30 LAB — POCT UA - MICROSCOPIC ONLY

## 2013-08-30 LAB — POCT URINALYSIS DIPSTICK
Glucose, UA: NEGATIVE
Ketones, UA: NEGATIVE
Nitrite, UA: POSITIVE
Protein, UA: >=300
Spec Grav, UA: 1.025
UROBILINOGEN UA: 0.2
pH, UA: 6

## 2013-08-30 MED ORDER — ONDANSETRON HCL 4 MG PO TABS: 4.0000 mg | ORAL_TABLET | Freq: Three times a day (TID) | ORAL | Status: DC | PRN

## 2013-08-30 MED ORDER — METHOCARBAMOL 500 MG PO TABS: 500.0000 mg | ORAL_TABLET | Freq: Three times a day (TID) | ORAL | Status: DC

## 2013-08-30 MED ORDER — PREGABALIN 25 MG PO CAPS: 75.0000 mg | ORAL_CAPSULE | Freq: Two times a day (BID) | ORAL | Status: DC

## 2013-08-30 MED ORDER — OXYCODONE-ACETAMINOPHEN 10-325 MG PO TABS: 1.0000 | ORAL_TABLET | ORAL | Status: DC | PRN

## 2013-08-30 MED ORDER — LEVOFLOXACIN 500 MG PO TABS: 500.0000 mg | ORAL_TABLET | Freq: Every day | ORAL | Status: DC

## 2013-08-30 NOTE — Patient Instructions (Signed)
Eucerin cream twice a day

## 2013-08-30 NOTE — Progress Notes (Signed)
Beth [name] is a 63 y.o. female who presents to [facility] today for UTI  UTI: dysuria but no frequency. Present for 2 mo but acutely worsened over 2 wks ago. Now w/ fevers and chills and nausea flank pain and abd pain. Deneis CP, SOB, sycnope. Feels week. Tolerating PO.   HTN: on lopressor  The following portions of the patient's history were reviewed and updated as appropriate: allergies, current medications, past medical history, family and social history, and problem list.  Patient is a smoker.  Past Medical History  Diagnosis Date  . Hypercholesterolemia   . Rheumatoid arthritis(714.0)   . Tobacco abuse   . COPD (chronic obstructive pulmonary disease)   . Bronchitis, chronic   . Diabetes mellitus   . Atrial fibrillation   . Osteoporosis   . Multiple URI   . Complication of anesthesia     with hysterectomy - " quit breathing" - no ICU   . Acute MI 2006    hx of 2 MI   . CAD (coronary artery disease)     hx of stents 2006   . Pneumonia     hx of   . Sleep apnea     cpap- does not use   . Anemia     hx of   . Hepatitis     at age 62     ROS as above otherwise neg.    Medications reviewed. Current Outpatient Prescriptions  Medication Sig Dispense Refill  . Blood Glucose Monitoring Suppl (PRODIGY BLOOD GLUCOSE MONITOR) DEVI 1 Device by Does not apply route daily.  1 each  0  . insulin glargine (LANTUS) 100 UNIT/ML injection Inject 0.25 mLs (25 Units total) into the skin at bedtime. USES SLIDING SCALE AT HOME  10 mL  5  . levofloxacin (LEVAQUIN) 500 MG tablet Take 1 tablet (500 mg total) by mouth daily.  7 tablet  0  . lisinopril (PRINIVIL,ZESTRIL) 2.5 MG tablet Take 2.5 mg by mouth daily.      . methocarbamol (ROBAXIN) 500 MG tablet Take 1 tablet (500 mg total) by mouth 3 (three) times daily.  60 tablet  2  . methotrexate 2.5 MG tablet Take 20 mg by mouth once a week. thursday      . metoprolol tartrate (LOPRESSOR) 25 MG tablet Take 25 mg by mouth 2 (two) times daily. 1/2  tablet tablet tid      . nitroGLYCERIN (NITROSTAT) 0.4 MG SL tablet Place 0.4 mg under the tongue as needed. For chest pain- if helps, call 911      . nystatin (MYCOSTATIN) 100000 UNIT/ML suspension Take 5 mLs (500,000 Units total) by mouth 4 (four) times daily.  60 mL  0  . ondansetron (ZOFRAN) 4 MG tablet Take 1 tablet (4 mg total) by mouth every 8 (eight) hours as needed for nausea or vomiting.  20 tablet  0  . oxyCODONE-acetaminophen (PERCOCET) 10-325 MG per tablet Take 1 tablet by mouth every 4 (four) hours as needed for pain (but not more than 5 per day).  150 tablet  0  . pregabalin (LYRICA) 25 MG capsule Take 3 capsules (75 mg total) by mouth 2 (two) times daily.  180 capsule  1  . promethazine (PHENERGAN) 25 MG tablet Take 1 tablet (25 mg total) by mouth daily as needed for nausea.  30 tablet  3   No current facility-administered medications for this visit.    Exam: BP 140/80  Pulse 90  Temp(Src) 97.2 F (36.2  C) (Oral) Gen: Well NAD HEENT: EOMI,  MMM Lungs: CTABL Nl WOB Heart: RRR no MRG Abd: NABS, suprapubic tenderness on palpation MSK: CVA tenderness bilat  Results for orders placed in visit on 08/30/13 (from the past 72 hour(s))  POCT URINALYSIS DIPSTICK     Status: Abnormal   Collection Time    08/30/13 11:57 AM      Result Value Ref Range   Color, UA AMBER     Clarity, UA CLEAR     Glucose, UA NEGATIVE     Bilirubin, UA SMALL     Ketones, UA NEGATIVE     Spec Grav, UA 1.025     Blood, UA LARGE     pH, UA 6.0     Protein, UA >=300     Urobilinogen, UA 0.2     Nitrite, UA POSITIVE     Leukocytes, UA Trace    POCT UA - MICROSCOPIC ONLY     Status: Abnormal   Collection Time    08/30/13 11:57 AM      Result Value Ref Range   WBC, Ur, HPF, POC LOADED     RBC, urine, microscopic 5-10     Bacteria, U Microscopic 3+     Epithelial cells, urine per micros 1-5      A/P (as seen in Problem list)  Pyelonephritis Prolonged illness w/ significant comorbidities   Pt very ill but stable and OK to go home w/ strict precautions Starting Levoquin (avoiding cipro due to concenr for Cdiff) Hemodynamically stable so low concern for urosepsis   HYPERTENSION, BENIGN ESSENTIAL Elevated today likely in part due to pain from illness Above goal (Pt w/ DM) based on JNC 8 guidelines F/u w/ PCP

## 2013-08-30 NOTE — Assessment & Plan Note (Signed)
Elevated today likely in part due to pain from illness Above goal (Pt w/ DM) based on JNC 8 guidelines F/u w/ PCP

## 2013-08-30 NOTE — Assessment & Plan Note (Addendum)
Prolonged illness w/ significant comorbidities  Pt very ill but stable and OK to go home w/ strict precautions Starting Levoquin (avoiding cipro due to concenr for Cdiff) Hemodynamically stable so low concern for urosepsis

## 2013-08-30 NOTE — Patient Instructions (Signed)
You have a kidney and bladder infection Please start the levoquin daily for 7 days Please start the zofran for nausea if needed Please come back in 7 days

## 2013-08-30 NOTE — Progress Notes (Signed)
Subjective:    Patient ID: Beth [name], female    DOB: [birth year], 63 y.o.   MRN: [id number]  HPI Kidney infection seeing PCP at [facility],  Fever, increased back pain, achy Done with IV antibiotics, after foot infection Hgb A1C- improved to 7.0   Now able wearing  Shoe Dressing and bathing self , needs help in and out bath    Pain Inventory Average Pain 9 Pain Right Now 9 My pain is sharp and aching  In the last 24 hours, has pain interfered with the following? General activity 9 Relation with others 9 Enjoyment of life 9 What TIME of day is your pain at its worst? all Sleep (in general) Poor  Pain is worse with: walking, bending, sitting, inactivity, standing and some activites Pain improves with: rest and medication Relief from Meds: 8  Mobility use a cane ability to climb steps?  no do you drive?  no  Function disabled: date disabled . I need assistance with the following:  dressing, bathing, meal prep, household duties and shopping  Neuro/Psych bladder control problems bowel control problems weakness trouble walking spasms  Prior Studies Any changes since last visit?  no  Physicians involved in your care Any changes since last visit?  no   Family History  Problem Relation Age of Onset  . COPD Mother   . Heart disease Father   . Diabetes Sister   . COPD Brother   . Diabetes Sister   . Diabetes Brother    History   Social History  . Marital Status: Divorced    Spouse Name: N/A    Number of Children: N/A  . Years of Education: N/A   Social History Main Topics  . Smoking status: Current Every Day Smoker -- 1.00 packs/day for 37 years    Types: Cigarettes  . Smokeless tobacco: Never Used  . Alcohol Use: No  . Drug Use: No  . Sexual Activity: None   Other Topics Concern  . None   Social History Narrative  . None   Past Surgical History  Procedure Laterality Date  . Appendectomy    . Abdominal hysterectomy    . Stents  2007    5  cardiac stents  . Coronary angioplasty    . Breast surgery      age 47 left benign tumor removed   . Back surgery      kyphoplasty- 2010  . Mass excision Right 11/09/2012    Procedure: EXCISION RIGHT FOOT DORSAL MASS;  Surgeon: Kathryne Hitch, MD;  Location: [facility];  Service: Orthopedics;  Laterality: Right;  . Foot surgery Right 11-09-12   Past Medical History  Diagnosis Date  . Hypercholesterolemia   . Rheumatoid arthritis(714.0)   . Tobacco abuse   . COPD (chronic obstructive pulmonary disease)   . Bronchitis, chronic   . Diabetes mellitus   . Atrial fibrillation   . Osteoporosis   . Multiple URI   . Complication of anesthesia     with hysterectomy - " quit breathing" - no ICU   . Acute MI 2006    hx of 2 MI   . CAD (coronary artery disease)     hx of stents 2006   . Pneumonia     hx of   . Sleep apnea     cpap- does not use   . Anemia     hx of   . Hepatitis     at age 74  BP 146/82  Pulse 91  Resp 16  Ht 4\' 11"  (1.499 m)  Wt 137 lb 3.2 oz (62.234 kg)  BMI 27.70 kg/m2  SpO2 96%  Opioid Risk Score:   Fall Risk Score: High Fall Risk (>13 points) (educated and home fall prevention handout given.) Review of Systems  Genitourinary:       Thinks she has a  UTI  Musculoskeletal: Positive for back pain.       Spasm  Neurological: Positive for weakness.  All other systems reviewed and are negative.       Objective:   Physical Exam  Nursing note and vitals reviewed. Constitutional: She is oriented to person, place, and time. She appears well-developed and well-nourished.  HENT:  Head: Normocephalic and atraumatic.  Right Ear: External ear normal.  Left Ear: External ear normal.  Eyes: EOM are normal. Pupils are equal, round, and reactive to light.  Musculoskeletal:       Right ankle: She exhibits deformity.  Right foot has soft tissue deficit in the anterior dorsal midfoot, no open lesions. Healed scars  Neurological: She is alert and oriented to  person, place, and time. She has normal strength and normal reflexes. A sensory deficit is present. Gait abnormal.  Numbness in the medial malleoli  Bilateral.  Uses cane for ambulation secondary to reduced balance  Psychiatric: She has a normal mood and affect.          Assessment & Plan:  1. Diabetes with neuropathy with gait disorder. Diabetic control improved. 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 Continue cane, continued Lyrica 75 twice a day 2. Chronic low back pain, history of compression fracture, history of spondylosis and degenerative disc, prior multilevel kyphoplasty. No evidence of radiculopathy  Continue oxycodone 10 mg 5 times per day. No signs of misuse.

## 2013-08-31 ENCOUNTER — Telehealth: Payer: Self-pay | Admitting: Family Medicine

## 2013-08-31 NOTE — Telephone Encounter (Signed)
[facility] After Hours Line.   Daughter calling in regards to her mother who asked her to call. She was diagnosed with pyelonephritis yesterday. She has had chills but no high fevers. Has vomited since leaving (nonbilious, nonbloody). Zofran did help. Is able to keep down the levofloxacin. Before starting, did have to stop while going up stairs and rest (was helped by grandson who stated she leaned over and stopped on the steps without syncope). Daughter says on a good day it is hard for her to get up the stairs.   Overall, there have been no major changes since seeing Dr. Konrad Dolores yesterday. Seems family expected a quick recovery with antibiotics and I discussed this may take a few days. She is welcome to come see [place] before planned 1 week follow up such as early next week if they desire in person evaluation. Told daughter that if fevers, symptoms, weakness were to worsen, she would need to be further evaluated.  Urine culture is still pending.   Aldine Contes. Marti Sleigh, MD, PGY-3 08/31/2013 11:06 AM

## 2013-09-01 LAB — URINE CULTURE: Colony Count: >=100000

## 2013-09-06 ENCOUNTER — Ambulatory Visit: Payer: Medicaid Other | Admitting: Family Medicine

## 2013-09-13 ENCOUNTER — Ambulatory Visit: Payer: Medicaid Other | Admitting: Family Medicine

## 2013-09-18 ENCOUNTER — Telehealth: Payer: Self-pay | Admitting: Family Medicine

## 2013-09-18 NOTE — Telephone Encounter (Signed)
LMVM that patient has to have an appt.  I also suggested that patient should go to Urgent Care if it is after hours.  Tais Koestner, Darlyne Russian, CMA

## 2013-09-18 NOTE — Telephone Encounter (Signed)
Pt thinks she has another kidney infection. She would like an antibotic called in . She doesn't have transportation Please advise

## 2013-09-25 ENCOUNTER — Other Ambulatory Visit: Payer: Self-pay | Admitting: *Deleted

## 2013-09-25 MED ORDER — PREGABALIN 25 MG PO CAPS: 75.0000 mg | ORAL_CAPSULE | Freq: Two times a day (BID) | ORAL | Status: AC

## 2013-09-25 MED ORDER — OXYCODONE-ACETAMINOPHEN 10-325 MG PO TABS: 1.0000 | ORAL_TABLET | ORAL | Status: DC | PRN

## 2013-09-26 ENCOUNTER — Telehealth: Payer: Self-pay

## 2013-09-26 NOTE — Telephone Encounter (Signed)
Patient wants to cancel her appt on 3/13 and pick up her refills. She said she just got out of the hospital.

## 2013-09-26 NOTE — Telephone Encounter (Signed)
Notified Beth [name]'s daughter that her rx will be up front to pick up.  Her granddaughter Beth [name] will pick up.

## 2013-10-09 ENCOUNTER — Ambulatory Visit: Payer: Medicaid Other | Admitting: Family Medicine

## 2013-10-16 ENCOUNTER — Telehealth: Payer: Self-pay | Admitting: Family Medicine

## 2013-10-16 NOTE — Telephone Encounter (Signed)
Was a phone number taken to speak with "Keisha"?   Will fwd to MD for FYI until I am able to speak to [facility] regarding patient.   Merelin Human, Darlyne Russian, CMA

## 2013-10-16 NOTE — Telephone Encounter (Signed)
Deanna Artis from [facility] called and wanted to report a PHQ-9 for Ashiya of 11.  Deanna Artis is setting an appointment with their behavioral screening center. Antoria doesn't take any medications for being depressed at the time. Almeter did state that she has thought about harming herself but didn't have plans made. She also needs the following things for her a home health nurse, a commode chair, a shower chair. Kaileah also needs a glucometer and all the supplies that go with sent in.

## 2013-10-17 NOTE — Telephone Encounter (Signed)
LMVM that patient needs to call and schedule an appt to be seen next week per Dr. Ermalinda Memos.  Carrina Schoenberger, Darlyne Russian, CMA

## 2013-10-17 NOTE — Telephone Encounter (Signed)
Will FWD this message to PCP to please place [facility] referral and Rx for DME items listed.  Raymar Joiner, Darlyne Russian, CMA

## 2013-10-17 NOTE — Telephone Encounter (Signed)
Patient with depression at [facility] with passive SI. Will ask staff to call and offer appt, she should be advised to seek help at behavioral health if she is having SI.   Murtis Sink, MD [facility] Family Medicine Resident, PGY-2 10/17/2013, 9:01 AM

## 2013-10-17 NOTE — Telephone Encounter (Signed)
Spoke with patient and she desperately needs a referral to [facility].  Pt also needs a Rx sent to [facility] for a commode chair and a shower chair.  She has her grandson in the home that helps her, but he was gone yesterday and patient was stuck on the toilet for over an hour yesterday.  This incident has caused her to be very sad/depressed.  She denies SI at this time.  Pt gave me Beth [name], [facility] phone numbers.  I will call Beth [name] for more information.  Radene Ou, [place]:  [phone] or [phone] (cell)

## 2013-10-17 NOTE — Telephone Encounter (Signed)
Patient requesting HH and DME, Will ask her to be seen.   Murtis Sink, MD [facility] Family Medicine Resident, PGY-2 10/17/2013, 4:37 PM

## 2013-10-21 ENCOUNTER — Telehealth: Payer: Self-pay

## 2013-10-21 MED ORDER — CYCLOBENZAPRINE HCL 5 MG PO TABS: 5.0000 mg | ORAL_TABLET | Freq: Three times a day (TID) | ORAL | Status: AC | PRN

## 2013-10-21 NOTE — Telephone Encounter (Signed)
Flexeril RX sent to pharmacy per Dr. Wynn Banker. Patient is aware.

## 2013-10-21 NOTE — Telephone Encounter (Signed)
Patient called and stated she is having really bad back spasms. She is requesting a muscle relaxant. She has a appt on 4/9. Please advise.

## 2013-10-21 NOTE — Telephone Encounter (Signed)
Flexeril 5 mg per os 3 times a day when necessary #30 no RF

## 2013-10-22 ENCOUNTER — Ambulatory Visit: Payer: Medicaid Other | Admitting: Physical Medicine & Rehabilitation

## 2013-10-23 ENCOUNTER — Telehealth: Payer: Self-pay | Admitting: Family Medicine

## 2013-10-23 NOTE — Telephone Encounter (Signed)
Will fwd to Md for review.  Symantha Steeber L Simrit Gohlke, CMA  

## 2013-10-23 NOTE — Telephone Encounter (Signed)
Beth [name] called: pt is not taking Beth [name] lisionpril.  Beth [name] BP has been slightly elevated during the last few visits. She would be willing to call pt and inform Beth [name] to take Beth [name] meds. Please advise

## 2013-10-24 ENCOUNTER — Ambulatory Visit (HOSPITAL_BASED_OUTPATIENT_CLINIC_OR_DEPARTMENT_OTHER): Payer: Medicaid Other | Admitting: Physical Medicine & Rehabilitation

## 2013-10-24 ENCOUNTER — Encounter: Payer: Self-pay | Admitting: Physical Medicine & Rehabilitation

## 2013-10-24 ENCOUNTER — Encounter: Payer: Medicaid Other | Attending: Physical Medicine & Rehabilitation

## 2013-10-24 VITALS — BP 94/50 | HR 90 | Resp 14 | Ht 59.0 in | Wt 137.0 lb

## 2013-10-24 DIAGNOSIS — Z5181 Encounter for therapeutic drug level monitoring: Secondary | ICD-10-CM

## 2013-10-24 DIAGNOSIS — F17200 Nicotine dependence, unspecified, uncomplicated: Secondary | ICD-10-CM | POA: Insufficient documentation

## 2013-10-24 DIAGNOSIS — M069 Rheumatoid arthritis, unspecified: Secondary | ICD-10-CM

## 2013-10-24 DIAGNOSIS — G609 Hereditary and idiopathic neuropathy, unspecified: Secondary | ICD-10-CM | POA: Insufficient documentation

## 2013-10-24 DIAGNOSIS — E119 Type 2 diabetes mellitus without complications: Secondary | ICD-10-CM | POA: Insufficient documentation

## 2013-10-24 DIAGNOSIS — M25579 Pain in unspecified ankle and joints of unspecified foot: Secondary | ICD-10-CM

## 2013-10-24 DIAGNOSIS — S300XXA Contusion of lower back and pelvis, initial encounter: Secondary | ICD-10-CM

## 2013-10-24 DIAGNOSIS — M549 Dorsalgia, unspecified: Secondary | ICD-10-CM

## 2013-10-24 DIAGNOSIS — G8929 Other chronic pain: Secondary | ICD-10-CM

## 2013-10-24 DIAGNOSIS — M545 Low back pain, unspecified: Secondary | ICD-10-CM | POA: Insufficient documentation

## 2013-10-24 DIAGNOSIS — Z79899 Other long term (current) drug therapy: Secondary | ICD-10-CM

## 2013-10-24 DIAGNOSIS — M1990 Unspecified osteoarthritis, unspecified site: Secondary | ICD-10-CM

## 2013-10-24 DIAGNOSIS — M810 Age-related osteoporosis without current pathological fracture: Secondary | ICD-10-CM

## 2013-10-24 MED ORDER — OXYCODONE-ACETAMINOPHEN 10-325 MG PO TABS: 1.0000 | ORAL_TABLET | ORAL | Status: DC | PRN

## 2013-10-24 NOTE — Telephone Encounter (Signed)
I would recommend her taking her lisinopril, The dose is so small its likely for renal protection with diabetes.  Murtis Sink, MD [facility] Family Medicine Resident, PGY-2 10/24/2013, 7:08 AM

## 2013-10-24 NOTE — Telephone Encounter (Signed)
Called pt. LMVM to call back. Please see message. Thanks. .Beth [name]  

## 2013-10-24 NOTE — Progress Notes (Signed)
Subjective:    Patient ID: Beth [name], female    DOB: 1951-06-04, 63 y.o.   MRN: [id number]  HPI Pt ran out of oxycodone 5 days early, pt states she was shorted by pharmacy. Her son is with her today, states that they have had some issues with this pharmacy on antibiotics and other medications as well. She has not had any recurrent problems with running out early, this looks like the first time.  Had recent fall, states that this is while she was being transported by EMS to the ED her tailbone hurts  Patient also notes transportation issue and feel she needs to be at a physical medicine rehabilitation clinic closer to home  Pain Inventory Average Pain 10 Pain Right Now 10 My pain is sharp and aching  In the last 24 hours, has pain interfered with the following? General activity 10 Relation with others 10 Enjoyment of life 10 What TIME of day is your pain at its worst? n/a Sleep (in general) Fair  Pain is worse with: walking, bending, sitting and standing Pain improves with: n/a Relief from Meds: n/a  Mobility use a cane ability to climb steps?  no do you drive?  no use a wheelchair Do you have any goals in this area?  no  Function not employed: date last employed . Do you have any goals in this area?  no  Neuro/Psych weakness trouble walking depression  Prior Studies Any changes since last visit?  no  Physicians involved in your care Any changes since last visit?  no   Family History  Problem Relation Age of Onset  . COPD Mother   . Heart disease Father   . Diabetes Sister   . COPD Brother   . Diabetes Sister   . Diabetes Brother    History   Social History  . Marital Status: Divorced    Spouse Name: N/A    Number of Children: N/A  . Years of Education: N/A   Social History Main Topics  . Smoking status: Current Every Day Smoker -- 1.00 packs/day for 37 years    Types: Cigarettes  . Smokeless tobacco: Never Used  . Alcohol Use: No  .  Drug Use: No  . Sexual Activity: None   Other Topics Concern  . None   Social History Narrative  . None   Past Surgical History  Procedure Laterality Date  . Appendectomy    . Abdominal hysterectomy    . Stents  2007    5 cardiac stents  . Coronary angioplasty    . Breast surgery      age 40 left benign tumor removed   . Back surgery      kyphoplasty- 2010  . Mass excision Right 11/09/2012    Procedure: EXCISION RIGHT FOOT DORSAL MASS;  Surgeon: Kathryne Hitch, MD;  Location: [facility];  Service: Orthopedics;  Laterality: Right;  . Foot surgery Right 11-09-12   Past Medical History  Diagnosis Date  . Hypercholesterolemia   . Rheumatoid arthritis(714.0)   . Tobacco abuse   . COPD (chronic obstructive pulmonary disease)   . Bronchitis, chronic   . Diabetes mellitus   . Atrial fibrillation   . Osteoporosis   . Multiple URI   . Complication of anesthesia     with hysterectomy - " quit breathing" - no ICU   . Acute MI 2006    hx of 2 MI   . CAD (coronary artery disease)  hx of stents 2006   . Pneumonia     hx of   . Sleep apnea     cpap- does not use   . Anemia     hx of   . Hepatitis     at age 42    BP 94/50  Pulse 90  Resp 14  Ht 4\' 11"  (1.499 m)  Wt 137 lb (62.143 kg)  BMI 27.66 kg/m2  SpO2 100%  Opioid Risk Score:   Fall Risk Score: High Fall Risk (>13 points) (patient educated handout declined)    Review of Systems  Gastrointestinal: Positive for nausea and vomiting.  Musculoskeletal: Positive for arthralgias, back pain, gait problem and myalgias.  Neurological: Positive for weakness.  All other systems reviewed and are negative.      Objective:   Physical Exam  General no acute distress, but sitting on her right hip area due to pain in the tailbone Mood and affect alert Spine no point tenderness from the cervical thoracic and lumbar area does have point tenderness over the coccyx area No pain with hip range of motion in range of  motion or ankle range of motion Decreased sensation in the feet compared to the knees to light touch No skin lesions except has chronic stasis dermatitis type changes but no current edema     Assessment & Plan:  #1. 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 She has chronic pain related to this and has been managed for many years at this clinic on narcotic analgesic medications. She ran out early this month but this has been a first. Will check urine drug screen. Last reported dose of oxycodone was 3 days ago so it may not show up Per patient request will make referral to [facility] Dr or Dr Percell Boston  2. Coccygeal pain. As I discussed with patient even if it is fractured would not change management. Therefore patient declines x-ray  Prescription for oxycodone 10 mg #150 Return to clinic when necessary

## 2013-10-24 NOTE — Patient Instructions (Addendum)
Referral to [facility] Dr Percell Boston or Dr Rozelle Logan

## 2013-11-05 ENCOUNTER — Telehealth: Payer: Self-pay

## 2013-11-05 NOTE — Telephone Encounter (Signed)
Multiple inconsistencies Patient reported running out of oxycodone 5 days prior to but had high levels on the urine drug screen. In addition hydrocodone was clearly positive  Wouldn't discharge from clinic based on violation of control substance agreement

## 2013-11-05 NOTE — Telephone Encounter (Signed)
Correction would discharge from clinic based on violation of CSA

## 2013-11-05 NOTE — Telephone Encounter (Signed)
Urine drug screen from 10/24/2013 was positive for hydrocodone and morphine.  Left message for patient to call office regarding these medications.  No history in chart of these medication.  Urine drug screen is inconsistent.

## 2013-11-07 NOTE — Telephone Encounter (Signed)
Left message trying to contact patient regarding her inconsistent urine drug screen.  A discharge letter will be mailed to patient.

## 2013-11-11 ENCOUNTER — Telehealth: Payer: Self-pay

## 2013-11-11 NOTE — Telephone Encounter (Signed)
Patient returned call to the clinic. Informed patient that Dr. Wynn Banker was discharging her due to inconsistent UDS. Patient said she took her last Oxycodone the day she done the UDS. She said that the ER gave her Morphine injection and possibly Hydrocodone. Patient said that Dr. Wynn Banker was aware of her going to the ER. Patient was referred to the [facility] clinic for med management prior to being discharged.

## 2013-11-15 ENCOUNTER — Encounter: Payer: Self-pay | Admitting: Family Medicine

## 2013-11-15 ENCOUNTER — Ambulatory Visit (INDEPENDENT_AMBULATORY_CARE_PROVIDER_SITE_OTHER): Payer: Medicaid Other | Admitting: Family Medicine

## 2013-11-15 VITALS — BP 122/81 | HR 97 | Temp 98.0°F | Ht 59.0 in

## 2013-11-15 DIAGNOSIS — R296 Repeated falls: Secondary | ICD-10-CM

## 2013-11-15 DIAGNOSIS — R300 Dysuria: Secondary | ICD-10-CM

## 2013-11-15 DIAGNOSIS — N390 Urinary tract infection, site not specified: Secondary | ICD-10-CM

## 2013-11-15 DIAGNOSIS — R29818 Other symptoms and signs involving the nervous system: Secondary | ICD-10-CM

## 2013-11-15 LAB — POCT URINALYSIS DIPSTICK
BILIRUBIN UA: NEGATIVE
Glucose, UA: NEGATIVE
Ketones, UA: NEGATIVE
Nitrite, UA: POSITIVE
Protein, UA: >=300
Spec Grav, UA: 1.025
Urobilinogen, UA: 0.2
pH, UA: 6

## 2013-11-15 LAB — POCT UA - MICROSCOPIC ONLY

## 2013-11-15 MED ORDER — PROMETHAZINE HCL 25 MG PO TABS: 25.0000 mg | ORAL_TABLET | Freq: Every day | ORAL | Status: DC | PRN

## 2013-11-15 MED ORDER — SULFAMETHOXAZOLE-TMP DS 800-160 MG PO TABS: 1.0000 | ORAL_TABLET | Freq: Two times a day (BID) | ORAL | Status: AC

## 2013-11-15 MED ORDER — LISINOPRIL 2.5 MG PO TABS: 2.5000 mg | ORAL_TABLET | Freq: Every day | ORAL | Status: DC

## 2013-11-15 MED ORDER — NITROGLYCERIN 0.4 MG SL SUBL: 0.4000 mg | SUBLINGUAL_TABLET | SUBLINGUAL | Status: AC | PRN

## 2013-11-15 NOTE — Patient Instructions (Addendum)
You have an UTI. Please treat with Bactrim twice daily for 7 days. Dr. Randolm Idol will call you with your culture results.   I think that your recent falls are multifactorial (due to arthritis, peripheral neuropathy, weakness).   Please make an appointment to establish care with Dr. Ermalinda Memos. Please discuss falls with Dr. Ermalinda Memos

## 2013-11-18 ENCOUNTER — Telehealth: Payer: Self-pay | Admitting: Family Medicine

## 2013-11-18 ENCOUNTER — Encounter: Payer: Self-pay | Admitting: Family Medicine

## 2013-11-18 DIAGNOSIS — N390 Urinary tract infection, site not specified: Secondary | ICD-10-CM

## 2013-11-18 DIAGNOSIS — R296 Repeated falls: Secondary | ICD-10-CM | POA: Insufficient documentation

## 2013-11-18 LAB — URINE CULTURE

## 2013-11-18 MED ORDER — PHENAZOPYRIDINE HCL 95 MG PO TABS: 95.0000 mg | ORAL_TABLET | Freq: Three times a day (TID) | ORAL | Status: AC | PRN

## 2013-11-18 NOTE — Progress Notes (Signed)
   Subjective:    Patient ID: Beth [name], female    DOB: March 19, 1951, 63 y.o.   MRN: [id number]  HPI 63 y/o female presents for evaluation of possible UTI. She has been treated for UTI twice since 08/2013. She was initially treated in 2/15 with Levaquin, she has improvement of her symptoms however she was treated again at a later date at [facility] ED, she received Levaquin again, she reports worsening dysuria over the past few weeks, subjective fevers and chills, mild abdominal pain, no vaginal discharge, no nausea, no emesis, she reports associated incontinence (mostly when sneezing or with physical activity).   Patient also report a history of falls, last fall was two nights ago when she was reaching down to pick something off the floor, no associated syncope/chest pain/lightheadedness, she reports worsening weakness over many months, she has a history of chronic back pain and OA, she is brought in today with the use of a wheelchair   Review of Systems  Constitutional: Positive for fever, chills and fatigue.  Respiratory: Negative for cough and shortness of breath.   Cardiovascular: Negative for chest pain and leg swelling.  Genitourinary: Positive for urgency. Negative for dysuria, flank pain and pelvic pain.  Musculoskeletal: Positive for back pain.       Objective:   Physical Exam Vitals: reviewed Gen: pleasant female, NAD, brought in by wheelchair by daughter and grandson Cardiac: RRR, S1 and S2 present, no murmurs, no heaves/thrills Resp: CTAB, normal effort Abd: soft, mild suprapubic tenderness, normal bowel sounds MSK/neuro: in wheelchair, strength in bilateral LE is 4+, sensation to light touch intact  Urine dipstick - positive for blood, LE, and Nitrites. Microscopy positive for WBC and bacteria.     Assessment & Plan:  Please see problem specific assessment and plan.

## 2013-11-18 NOTE — Assessment & Plan Note (Signed)
Patient has had recurrent falls which appear to me multifactorial (generalized weakness/peripheral neuropathy/OA). She also has a current UTI which may be contributing to her weakness. Her falls were not fully evaluated at this visit as she presented for workup for UTI. I have counseled her to make a follow up appointment with her PCP Dr. Ermalinda Memos to discuss this issue in more detail.

## 2013-11-18 NOTE — Telephone Encounter (Signed)
Informed patient of culture results. Dysuria mildly improved, no fevers. Sent pyridium to pharmacy to help with dysuria. Return precautions given.

## 2013-11-18 NOTE — Assessment & Plan Note (Signed)
Patient presents with signs/symptoms consistent with UTI. UA in office consistent with UTI. No clinical concern for Pyelonephritis at this time (patient afebrile).  -Initiate treatment with Bactrim -Will contact patient with culture results -Return precautions discussed

## 2013-11-21 ENCOUNTER — Telehealth: Payer: Self-pay

## 2013-11-21 MED ORDER — OXYCODONE-ACETAMINOPHEN 10-325 MG PO TABS: 1.0000 | ORAL_TABLET | ORAL | Status: AC | PRN

## 2013-11-21 NOTE — Telephone Encounter (Signed)
Patient is requesting a refill on Oxycodone. She has a appt at a clinic in [place] on 5/22, but she said she will be out of medication before then. Last fill date 4/9. Please advise.

## 2013-11-21 NOTE — Telephone Encounter (Signed)
Ok for 1 mo refill Last Rx

## 2013-11-21 NOTE — Telephone Encounter (Signed)
Notified final rx available

## 2013-11-21 NOTE — Telephone Encounter (Signed)
rx printed for Beth [name] to sign with note to pharmacist last rx from [employer]

## 2013-11-27 ENCOUNTER — Telehealth: Payer: Self-pay | Admitting: Family Medicine

## 2013-11-27 ENCOUNTER — Other Ambulatory Visit: Payer: Self-pay | Admitting: Family Medicine

## 2013-11-27 NOTE — Telephone Encounter (Signed)
Received fax from [facility] med review that states pt has stopped lisinopril 2.5 mg due to hypotension, They recommend decreasing BB and adding it back.   That would be reasonable, she has Afib and rate controlled which would be the limiting factor in decreasing her coreg. Will discuss at our next visit.   Murtis Sink, MD [facility] Family Medicine Resident, PGY-2 11/27/2013, 12:27 PM

## 2013-12-10 ENCOUNTER — Telehealth: Payer: Self-pay | Admitting: Family Medicine

## 2013-12-10 NOTE — Telephone Encounter (Signed)
Pt called and needs a refill on her Phenergan called in to the [facility] on [address] in [place]. jw

## 2013-12-10 NOTE — Telephone Encounter (Signed)
Called patient and told her that she still has three refills on her phenergan, it was sent in by Dr Randolm Idol on 11/15/13 with 3 refills. She is having nausea with no vomiting caused by the antibiotics she is taking from the hospital visit. She was admitted for 4 days because of hypotension and UTI. I told her to call the pharmacy to have the phenergan refilled and if that doesn't help with the nausea to let us know. She does have a follow up appointment with Dr Ermalinda Memos on 12/19/13.Beth [name]

## 2013-12-13 ENCOUNTER — Telehealth: Payer: Self-pay | Admitting: Family Medicine

## 2013-12-13 NOTE — Telephone Encounter (Signed)
Pt called and would like pain medication called in. She would also like Dr. Ermalinda Memos to call her. jw

## 2013-12-16 ENCOUNTER — Telehealth: Payer: Self-pay

## 2013-12-16 NOTE — Telephone Encounter (Signed)
Pt called back again to ask for pain medication.  Have an appt with provider on Thursday, but can't wait until then.  Please call patient back

## 2013-12-16 NOTE — Telephone Encounter (Signed)
Will fwd to PCP for review. .Beth [name]  

## 2013-12-16 NOTE — Telephone Encounter (Signed)
Patient called to see if she could get more pain pills.  Advised patient to contact PCP.

## 2013-12-17 ENCOUNTER — Telehealth: Payer: Self-pay | Admitting: *Deleted

## 2013-12-17 NOTE — Telephone Encounter (Signed)
Fayrene Fearing called requesting NPI number; pt will be seen at [facility] center.  NPI number given x 3 visits.  Clovis Pu, RN

## 2013-12-17 NOTE — Telephone Encounter (Signed)
Tried to return call, left VM.   Murtis Sink, MD [facility] Family Medicine Resident, PGY-2 12/17/2013, 8:32 AM

## 2013-12-19 ENCOUNTER — Encounter: Payer: Self-pay | Admitting: Family Medicine

## 2013-12-19 ENCOUNTER — Ambulatory Visit (INDEPENDENT_AMBULATORY_CARE_PROVIDER_SITE_OTHER): Payer: Medicaid Other | Admitting: Family Medicine

## 2013-12-19 VITALS — BP 130/80 | HR 80 | Temp 98.1°F | Ht 59.0 in | Wt 137.0 lb

## 2013-12-19 DIAGNOSIS — I2510 Atherosclerotic heart disease of native coronary artery without angina pectoris: Secondary | ICD-10-CM

## 2013-12-19 DIAGNOSIS — R079 Chest pain, unspecified: Secondary | ICD-10-CM

## 2013-12-19 DIAGNOSIS — I4891 Unspecified atrial fibrillation: Secondary | ICD-10-CM

## 2013-12-19 DIAGNOSIS — R296 Repeated falls: Secondary | ICD-10-CM | POA: Insufficient documentation

## 2013-12-19 DIAGNOSIS — R29898 Other symptoms and signs involving the musculoskeletal system: Secondary | ICD-10-CM

## 2013-12-19 DIAGNOSIS — E1165 Type 2 diabetes mellitus with hyperglycemia: Secondary | ICD-10-CM

## 2013-12-19 DIAGNOSIS — M069 Rheumatoid arthritis, unspecified: Secondary | ICD-10-CM

## 2013-12-19 DIAGNOSIS — Z9181 History of falling: Secondary | ICD-10-CM

## 2013-12-19 DIAGNOSIS — IMO0002 Reserved for concepts with insufficient information to code with codable children: Secondary | ICD-10-CM

## 2013-12-19 DIAGNOSIS — E785 Hyperlipidemia, unspecified: Secondary | ICD-10-CM

## 2013-12-19 DIAGNOSIS — R29818 Other symptoms and signs involving the nervous system: Secondary | ICD-10-CM

## 2013-12-19 DIAGNOSIS — IMO0001 Reserved for inherently not codable concepts without codable children: Secondary | ICD-10-CM

## 2013-12-19 LAB — CBC WITH DIFFERENTIAL/PLATELET
BASOS PCT: 1 % (ref 0–1)
Basophils Absolute: 0.1 10*3/uL (ref 0.0–0.1)
EOS ABS: 0.1 10*3/uL (ref 0.0–0.7)
Eosinophils Relative: 2 % (ref 0–5)
HCT: 44.4 % (ref 36.0–46.0)
Hemoglobin: 14.7 g/dL (ref 12.0–15.0)
Lymphocytes Relative: 61 % — ABNORMAL HIGH (ref 12–46)
Lymphs Abs: 3.5 10*3/uL (ref 0.7–4.0)
MCH: 28.9 pg (ref 26.0–34.0)
MCHC: 33.1 g/dL (ref 30.0–36.0)
MCV: 87.4 fL (ref 78.0–100.0)
Monocytes Absolute: 0.2 10*3/uL (ref 0.1–1.0)
Monocytes Relative: 4 % (ref 3–12)
NEUTROS PCT: 32 % — AB (ref 43–77)
Neutro Abs: 1.9 10*3/uL (ref 1.7–7.7)
PLATELETS: 271 10*3/uL (ref 150–400)
RBC: 5.08 MIL/uL (ref 3.87–5.11)
RDW: 17 % — ABNORMAL HIGH (ref 11.5–15.5)
WBC: 5.8 10*3/uL (ref 4.0–10.5)

## 2013-12-19 LAB — POCT GLYCOSYLATED HEMOGLOBIN (HGB A1C): Hemoglobin A1C: 6.3

## 2013-12-19 LAB — GLUCOSE, CAPILLARY: GLUCOSE-CAPILLARY: 183 mg/dL — AB (ref 70–99)

## 2013-12-19 MED ORDER — ACCU-CHEK NANO SMARTVIEW W/DEVICE KIT: 1.0000 | PACK | Status: AC | PRN

## 2013-12-19 MED ORDER — PREDNISONE 20 MG PO TABS: 40.0000 mg | ORAL_TABLET | Freq: Every day | ORAL | Status: AC

## 2013-12-19 MED ORDER — GLUCOSE BLOOD VI STRP: ORAL_STRIP | Status: AC

## 2013-12-19 MED ORDER — ATORVASTATIN CALCIUM 40 MG PO TABS: 40.0000 mg | ORAL_TABLET | Freq: Every day | ORAL | Status: AC

## 2013-12-19 MED ORDER — ACCU-CHEK MULTICLIX LANCETS MISC: Status: AC

## 2013-12-19 MED ORDER — LISINOPRIL 2.5 MG PO TABS: 2.5000 mg | ORAL_TABLET | Freq: Every day | ORAL | Status: AC

## 2013-12-19 NOTE — Assessment & Plan Note (Signed)
Patient with 2 previous heart attacks, stents in place Start aspirin today Restart lisinopril, also for diabetic nephropathy Start Lipitor Followup with cardiology

## 2013-12-19 NOTE — Assessment & Plan Note (Signed)
Chronic, rate controlled, irregularly irregular on physical exam today Chads 2 Vasc score is 4, however she has frequent falls and says a poor anticoagulation candidate Discussed pros and cons of anticoagulation, patient would not like to take Xarelto at this time Rate controlled without medication at this time

## 2013-12-19 NOTE — Assessment & Plan Note (Signed)
Right-sided atypical chest pain with recent negative cardiac workup at [facility] regional No red flags, likely musculoskeletal as she is very tender to palpation Discussed risk of pneumonia development with lack of deep breathing that may lead to atelectasis Discussed very low threshold for returning for care due to risk of pneumonia development

## 2013-12-19 NOTE — Assessment & Plan Note (Signed)
A1c 6.3 today Send glucose monitor Continue Lantus 25 units daily Followup three-month

## 2013-12-19 NOTE — Assessment & Plan Note (Signed)
Previous total cholesterol 187, LDL 120 With diabetes and history of CAD statin is indicated Start Lipitor 40 today

## 2013-12-19 NOTE — Progress Notes (Signed)
Patient ID: Beth [name], female   DOB: [birth year], 63 y.o.   MRN: [id number]  [name], MD Phone: [phone]  Subjective:  Chief complaint-noted  Pt Here for Diabetes, heart disease, recent hospitalization followup  Recently admitted to [facility] for UTI with hypotension. States she was treated with IV antibiotics and recovered well. She was worked up for chest pain as well.  CAD She does not take cholesterol medicine we'll watch her diet, she's had 2 heart attacks in the past, does not take aspirin, has stents  Diabetes She does not watch her diet She takes Lantus 25 units daily She needs a glucose meter today  Rheumatoid arthritis States that she's had a flareup in her hands for about 3 days, she requests prednisone today. She previously was on methotrexate but has not seen a rheumatologist for quite some time. Her new pain doctor is referred her to a new rheumatologist in [facility].  Chronic pain She states that she has chronic back pain and has had 3 kyphoplasty in the past She takes 10 mg of oxycodone 5 times daily, she's recently released by a pain doctor in our system and now has a new pain doctor at [facility]. She recently had hydrocodone in her urine as well, however was not prescribed this.  Frequent falls, A. fib States that she's been falling a lot more lately, for the past 4 months Has fallen approximately 3 times in the last month. Has a history of A. Fib, previously on Coumadin and did not like it due to "ill feelings" and frequent lab checks Has never had major bleeding event.  Chest pain States that she's had chest pains and she is discharged from the hospital, described as a right-sided rib pain radiating from her sternum around to her right axillary line., And distribution of her bra Negative cardiac workup and recent admission per her report No dyspnea Feels that is keeping her from taking deep breaths  ROS-  Per HPI No  fever, chills, sweats And dyspnea Positive chest Abdominal pain  Past Medical History Patient Active Problem List   Diagnosis Date Noted  . Frequent falls 12/19/2013  . Muscular deconditioning 12/19/2013  . Repeated falls 11/18/2013  . UTI (urinary tract infection) 11/15/2013  . Pyelonephritis 08/30/2013  . Foot mass 11/09/2012  . Mass of foot 10/05/2012  . Wrist pain, chronic 09/19/2011  . Plantar fasciitis of left foot 09/19/2011  . Back pain, chronic 09/19/2011  . Ankle pain, chronic 09/19/2011  . Chronic elbow pain 09/19/2011  . Diabetic peripheral neuropathy associated with type 2 diabetes mellitus 09/19/2011  . Multiple URI   . Bronchitis 11/09/2010  . UPPER RESPIRATORY INFECTION 03/18/2010  . WEIGHT LOSS, ABNORMAL 03/18/2010  . WHEEZING 03/18/2010  . CANDIDIASIS, VULVOVAGINAL, RECURRENT 03/03/2009  . SINUSITIS, ACUTE 09/12/2008  . Dysuria 09/12/2008  . CHEST PAIN UNSPECIFIED 04/22/2008  . Diabetes mellitus out of control 10/17/2006  . HYPERLIPIDEMIA 10/17/2006  . Tobacco Use Disorder 10/17/2006  . HYPERTENSION, BENIGN ESSENTIAL 10/17/2006  . Rheumatoid Arthritis 10/17/2006  . DM (diabetes mellitus), type 2, uncontrolled 09/14/2006  . HYPERCHOLESTEROLEMIA 09/14/2006  . CORONARY, ARTERIOSCLEROSIS 09/14/2006  . ATRIAL FIBRILLATION 09/14/2006  . GASTROESOPHAGEAL REFLUX, NO ESOPHAGITIS 09/14/2006  . OSTEOARTHRITIS, MULTI SITES 09/14/2006  . OSTEOPOROSIS, UNSPECIFIED 09/14/2006    Medications- reviewed and updated Current Outpatient Prescriptions  Medication Sig Dispense Refill  . cyclobenzaprine (FLEXERIL) 5 MG tablet Take 1 tablet (5 mg total) by mouth 3 (three) times daily as needed  for muscle spasms.  30 tablet  0  . insulin glargine (LANTUS) 100 UNIT/ML injection Inject 0.25 mLs (25 Units total) into the skin at bedtime. USES SLIDING SCALE AT HOME  10 mL  5  . oxyCODONE-acetaminophen (PERCOCET) 10-325 MG per tablet Take 1 tablet by mouth every 4 (four) hours as  needed for pain (but not more than 5 per day).  150 tablet  0  . pregabalin (LYRICA) 25 MG capsule Take 3 capsules (75 mg total) by mouth 2 (two) times daily.  180 capsule  1  . promethazine (PHENERGAN) 25 MG tablet Take 1 tablet (25 mg total) by mouth daily as needed for nausea.  30 tablet  3  . atorvastatin (LIPITOR) 40 MG tablet Take 1 tablet (40 mg total) by mouth daily.  90 tablet  3  . Blood Glucose Monitoring Suppl (ACCU-CHEK NANO SMARTVIEW) W/DEVICE KIT 1 Device by Does not apply route as needed.  1 kit  0  . Blood Glucose Monitoring Suppl (PRODIGY BLOOD GLUCOSE MONITOR) DEVI 1 Device by Does not apply route daily.  1 each  0  . glucose blood (ACCU-CHEK SMARTVIEW) test strip Check sugar 6 x daily  200 each  3  . Lancets (ACCU-CHEK MULTICLIX) lancets Check sugar 6 x daily  200 each  3  . lisinopril (PRINIVIL,ZESTRIL) 2.5 MG tablet Take 1 tablet (2.5 mg total) by mouth daily.  90 tablet  1  . nitroGLYCERIN (NITROSTAT) 0.4 MG SL tablet Place 1 tablet (0.4 mg total) under the tongue as needed. For chest pain- if helps, call 911  20 tablet  0  . phenazopyridine (PYRIDIUM) 95 MG tablet Take 1 tablet (95 mg total) by mouth 3 (three) times daily as needed for pain.  10 tablet  0  . predniSONE (DELTASONE) 20 MG tablet Take 2 tablets (40 mg total) by mouth daily with breakfast.  14 tablet  0  . sulfamethoxazole-trimethoprim (BACTRIM DS) 800-160 MG per tablet Take 1 tablet by mouth 2 (two) times daily.  14 tablet  0   No current facility-administered medications for this visit.    Objective: BP 130/80  Pulse 80  Temp(Src) 98.1 F (36.7 C) (Oral)  Ht $R'4\' 11"'KH$  (1.499 m)  Wt 137 lb (62.143 kg)  BMI 27.66 kg/m2 Gen: NAD, alert, cooperative with exam HEENT: NCAT CV: regular rate, irregularly irregular, good S1/S2, no murmur Resp: CTABL, no wheezes, non-labored Ext: No edema, warm Neuro: Alert and oriented, No gross deficits MSK: Stands with much effort from the wheelchair, takes greater than 15  seconds to stand.   Assessment/Plan:  ATRIAL FIBRILLATION Chronic, rate controlled, irregularly irregular on physical exam today Chads 2 Vasc score is 4, however she has frequent falls and says a poor anticoagulation candidate Discussed pros and cons of anticoagulation, patient would not like to take Xarelto at this time Rate controlled without medication at this time  CHEST PAIN UNSPECIFIED Right-sided atypical chest pain with recent negative cardiac workup at [facility] regional No red flags, likely musculoskeletal as she is very tender to palpation Discussed risk of pneumonia development with lack of deep breathing that may lead to atelectasis Discussed very low threshold for returning for care due to risk of pneumonia development  CORONARY, ARTERIOSCLEROSIS Patient with 2 previous heart attacks, stents in place Start aspirin today Restart lisinopril, also for diabetic nephropathy Start Lipitor Followup with cardiology  DM (diabetes mellitus), type 2, uncontrolled A1c 6.3 today Send glucose monitor Continue Lantus 25 units daily Followup three-month  HYPERLIPIDEMIA Previous total cholesterol 187, LDL 120 With diabetes and history of CAD statin is indicated Start Lipitor 40 today  Muscular deconditioning Face-to-face order for home health physical therapy written today Clearly needs several assistive devices, written prescription for shower chair, elevated toilet seat, walker with wheels Definitely needs physical therapy to regain her function.  Rheumatoid Arthritis Current flare, previously on methotrexate but not now Plans to see rheumatology, has gotten a referral from pain management already Burst of prednisone 40 mg daily x7 days    Orders Placed This Encounter  Procedures  . Glucose, capillary  . CBC with Differential  . Comprehensive metabolic panel  . Lipid Panel  . Ambulatory referral to Home Health    Referral Priority:  Routine    Referral Type:   Home Health Care    Referral Reason:  Specialty Services Required    Requested Specialty:  [place]    Number of Visits Requested:  1  . POCT glycosylated hemoglobin (Hb A1C)  . Glucose (CBG)    Meds ordered this encounter  Medications  . lisinopril (PRINIVIL,ZESTRIL) 2.5 MG tablet    Sig: Take 1 tablet (2.5 mg total) by mouth daily.    Dispense:  90 tablet    Refill:  1  . predniSONE (DELTASONE) 20 MG tablet    Sig: Take 2 tablets (40 mg total) by mouth daily with breakfast.    Dispense:  14 tablet    Refill:  0  . atorvastatin (LIPITOR) 40 MG tablet    Sig: Take 1 tablet (40 mg total) by mouth daily.    Dispense:  90 tablet    Refill:  3  . glucose blood (ACCU-CHEK SMARTVIEW) test strip    Sig: Check sugar 6 x daily    Dispense:  200 each    Refill:  3    For use with [employer] Nano meter. For questions regarding this prescription please call [phone].  . Lancets (ACCU-CHEK MULTICLIX) lancets    Sig: Check sugar 6 x daily    Dispense:  200 each    Refill:  3    For use with Multiclix lancet device. For questions regarding this prescription please call [phone]  . Blood Glucose Monitoring Suppl (ACCU-CHEK NANO SMARTVIEW) W/DEVICE KIT    Sig: 1 Device by Does not apply route as needed.    Dispense:  1 kit    Refill:  0

## 2013-12-19 NOTE — Assessment & Plan Note (Signed)
Current flare, previously on methotrexate but not now Plans to see rheumatology, has gotten a referral from pain management already Burst of prednisone 40 mg daily x7 days

## 2013-12-19 NOTE — Assessment & Plan Note (Signed)
Face-to-face order for home health physical therapy written today Clearly needs several assistive devices, written prescription for shower chair, elevated toilet seat, walker with wheels Definitely needs physical therapy to regain her function.

## 2013-12-19 NOTE — Patient Instructions (Signed)
Great to meet you!  Please come back in 2-3 weeks for follow up

## 2013-12-20 ENCOUNTER — Encounter: Payer: Self-pay | Admitting: Family Medicine

## 2013-12-20 LAB — COMPREHENSIVE METABOLIC PANEL
ALBUMIN: 2.6 g/dL — AB (ref 3.5–5.2)
ALT: 13 U/L (ref 0–35)
AST: 20 U/L (ref 0–37)
Alkaline Phosphatase: 116 U/L (ref 39–117)
BILIRUBIN TOTAL: 0.3 mg/dL (ref 0.2–1.2)
BUN: 7 mg/dL (ref 6–23)
CO2: 26 mEq/L (ref 19–32)
Calcium: 8.5 mg/dL (ref 8.4–10.5)
Chloride: 103 mEq/L (ref 96–112)
Creat: 0.83 mg/dL (ref 0.50–1.10)
GLUCOSE: 177 mg/dL — AB (ref 70–99)
Potassium: 3.6 mEq/L (ref 3.5–5.3)
SODIUM: 135 meq/L (ref 135–145)
Total Protein: 7.4 g/dL (ref 6.0–8.3)

## 2013-12-20 LAB — LIPID PANEL
Cholesterol: 168 mg/dL (ref 0–200)
HDL: 24 mg/dL — ABNORMAL LOW (ref >39)
LDL CALC: 88 mg/dL (ref 0–99)
TRIGLYCERIDES: 281 mg/dL — AB (ref <150)
Total CHOL/HDL Ratio: 7 Ratio
VLDL: 56 mg/dL — AB (ref 0–40)

## 2013-12-27 ENCOUNTER — Ambulatory Visit: Payer: Medicaid Other | Admitting: Family Medicine

## 2013-12-30 ENCOUNTER — Telehealth: Payer: Self-pay | Admitting: Family Medicine

## 2013-12-30 NOTE — Telephone Encounter (Signed)
Pt with [facility]: Plan of care: to see pt 2 times per week for 5 weeks Would like to add nursing evaluation due to wound on left foot

## 2013-12-30 NOTE — Telephone Encounter (Signed)
Received the RX for shower chair, raised toilet seat and walker Needs the notes from her last visit Please fax to [phone] Beth [name]

## 2013-12-30 NOTE — Telephone Encounter (Signed)
Called June Cacho back and gave verbal order for nursing evaluation. Patient is diabetic and has a left foot wound, she has a follow up appointment on 01/07/14 with Dr Ermalinda Memos. forward to PCP for FYI.Angeligue Bowne, Rodena Medin

## 2014-01-07 ENCOUNTER — Inpatient Hospital Stay: Payer: Medicaid Other | Admitting: Family Medicine

## 2014-01-13 ENCOUNTER — Telehealth: Payer: Self-pay | Admitting: *Deleted

## 2014-01-13 ENCOUNTER — Telehealth: Payer: Self-pay | Admitting: Clinical

## 2014-01-13 NOTE — Telephone Encounter (Signed)
CSW contacted pt home phone however daughter Beth [name] answered and informed CSW that pt was unable to come to the phone as she was in a lot of pain. CSW inquired whether daughter lives with pt and Beth [name] confirmed. CSW briefly told Beth [name] the reason for CSW call, potential placement. Beth [name] confirmed that she and pt are agreeable to this plan. Pt lives in a mobile home that has approximately 12 steps. CSW informed Beth [name] that CSW can begin process once receiving consent from pt. Daughter placed the phone on speaker and CSW was able to speak with pt and receive verbal consent to pursue SNF placement. CSW observed that pt sounded in pain. Daughter stated that pt has an appt at the pain center today. CSW provided emotional support.   CSW will see pt and daughter on 7/2 after appt with PCP.  Theresia Bough, MSW, LCSW [phone]

## 2014-01-13 NOTE — Telephone Encounter (Signed)
Dr. Ermalinda Memos can you give Amber the RN with [facility] a call so you can place the verbal order for a [facility] social worker? I will contact the pt to see if she is interested in placement.  Theresia Bough, MSW, LCSW [phone]

## 2014-01-13 NOTE — Telephone Encounter (Signed)
Called and gave verbal order for [facility] SW.   Would consider [facility] case mngmnt if unable to place   Murtis Sink, MD [facility] Family Medicine Resident, PGY-2 01/13/2014, 12:25 PM

## 2014-01-13 NOTE — Telephone Encounter (Signed)
HH calling for social work as pt is having a hard time getting around her house. She may need short term SNF placement.   Will forward to LCSW  Murtis Sink, MD [facility] Family Medicine Resident, PGY-2 01/13/2014, 11:01 AM

## 2014-01-13 NOTE — Telephone Encounter (Signed)
Hospital doctor, RN from [facility] called needing an order for social work consult due to pt having difficult getting downstairs of her home and trouble getting to/ from doctors appt.  She also wanted the consult for possible short term placement.  Pt complained of nausea/ vomiting for a couple of days.  Please give Amber a call at [phone] if questions.  Clovis Pu, RN

## 2014-01-15 ENCOUNTER — Telehealth: Payer: Self-pay | Admitting: Clinical

## 2014-01-15 NOTE — Telephone Encounter (Signed)
CSW contacted pt daughter and informed her that pt has 3 bed offers for SNF placement [facility], [facility].) If CSW in clinic tomorrow daughter and pt are agreeable to meet with CSW and discuss placement options.  Theresia Bough, MSW, LCSW [phone]

## 2014-01-16 ENCOUNTER — Encounter: Payer: Self-pay | Admitting: Family Medicine

## 2014-01-16 ENCOUNTER — Ambulatory Visit (INDEPENDENT_AMBULATORY_CARE_PROVIDER_SITE_OTHER): Payer: Medicaid Other | Admitting: Family Medicine

## 2014-01-16 VITALS — BP 115/71 | HR 91 | Temp 99.2°F | Ht 59.0 in | Wt 137.0 lb

## 2014-01-16 DIAGNOSIS — S81809A Unspecified open wound, unspecified lower leg, initial encounter: Secondary | ICD-10-CM

## 2014-01-16 DIAGNOSIS — I480 Paroxysmal atrial fibrillation: Secondary | ICD-10-CM

## 2014-01-16 DIAGNOSIS — S81801A Unspecified open wound, right lower leg, initial encounter: Secondary | ICD-10-CM | POA: Insufficient documentation

## 2014-01-16 DIAGNOSIS — I4891 Unspecified atrial fibrillation: Secondary | ICD-10-CM

## 2014-01-16 NOTE — Assessment & Plan Note (Signed)
Rate controlled, regular today No anticoagulation due to fall risk per discussion with pt and family.

## 2014-01-16 NOTE — Progress Notes (Signed)
Patient ID: Beth [name], female   DOB: Feb 22, 1951, 63 y.o.   MRN: [id number]  [name], MD Phone: [phone]  Subjective:  Chief complaint-noted  Pt Here for hospitalization followup.  I last saw her on June 4. Since then she's been admitted to [facility] again. She states that this is for right lower extremity wound infection. She states that when she presented to the ER her blood pressure was 60/40 but quickly responded without pressors. She denies any time in ICU. She states that she was treated with IV antibiotics and that she's been sent home with IV antibiotic. She currently has a PICC line in place and is getting an antibiotic that she can resume mouth 3 times a day from a family member.  She also states that she'll UTI during this hospitalization that's been treated 6 times and continues to recur.  She states that currently she has severe back pain due to a new compression fracture. She is established with the pain management clinic at [facility] and has oxycodone prescribed by them.  She has home health nursing and social work following her. I recently received a call and we pursued SNF placement which she now has to get off her sore. She intends to check out the SNFs and decide which one to go to for a few weeks for intense physical therapy, IV antibiotics, and close monitoring of her wound and other medical problems.  ROS-  Per HPI  Past Medical History Patient Active Problem List   Diagnosis Date Noted  . Frequent falls 12/19/2013  . Muscular deconditioning 12/19/2013  . Repeated falls 11/18/2013  . UTI (urinary tract infection) 11/15/2013  . Pyelonephritis 08/30/2013  . Foot mass 11/09/2012  . Mass of foot 10/05/2012  . Wrist pain, chronic 09/19/2011  . Plantar fasciitis of left foot 09/19/2011  . Back pain, chronic 09/19/2011  . Ankle pain, chronic 09/19/2011  . Chronic elbow pain 09/19/2011  . Diabetic peripheral neuropathy associated with  type 2 diabetes mellitus 09/19/2011  . Multiple URI   . Bronchitis 11/09/2010  . UPPER RESPIRATORY INFECTION 03/18/2010  . WEIGHT LOSS, ABNORMAL 03/18/2010  . WHEEZING 03/18/2010  . CANDIDIASIS, VULVOVAGINAL, RECURRENT 03/03/2009  . SINUSITIS, ACUTE 09/12/2008  . Dysuria 09/12/2008  . CHEST PAIN UNSPECIFIED 04/22/2008  . Diabetes mellitus out of control 10/17/2006  . HYPERLIPIDEMIA 10/17/2006  . Tobacco Use Disorder 10/17/2006  . HYPERTENSION, BENIGN ESSENTIAL 10/17/2006  . Rheumatoid Arthritis 10/17/2006  . DM (diabetes mellitus), type 2, uncontrolled 09/14/2006  . HYPERCHOLESTEROLEMIA 09/14/2006  . CORONARY, ARTERIOSCLEROSIS 09/14/2006  . ATRIAL FIBRILLATION 09/14/2006  . GASTROESOPHAGEAL REFLUX, NO ESOPHAGITIS 09/14/2006  . OSTEOARTHRITIS, MULTI SITES 09/14/2006  . OSTEOPOROSIS, UNSPECIFIED 09/14/2006    Medications- reviewed and updated Current Outpatient Prescriptions  Medication Sig Dispense Refill  . atorvastatin (LIPITOR) 40 MG tablet Take 1 tablet (40 mg total) by mouth daily.  90 tablet  3  . Blood Glucose Monitoring Suppl (ACCU-CHEK NANO SMARTVIEW) W/DEVICE KIT 1 Device by Does not apply route as needed.  1 kit  0  . Blood Glucose Monitoring Suppl (PRODIGY BLOOD GLUCOSE MONITOR) DEVI 1 Device by Does not apply route daily.  1 each  0  . cyclobenzaprine (FLEXERIL) 5 MG tablet Take 1 tablet (5 mg total) by mouth 3 (three) times daily as needed for muscle spasms.  30 tablet  0  . glucose blood (ACCU-CHEK SMARTVIEW) test strip Check sugar 6 x daily  200 each  3  . insulin glargine (LANTUS)  100 UNIT/ML injection Inject 0.25 mLs (25 Units total) into the skin at bedtime. USES SLIDING SCALE AT HOME  10 mL  5  . Lancets (ACCU-CHEK MULTICLIX) lancets Check sugar 6 x daily  200 each  3  . lisinopril (PRINIVIL,ZESTRIL) 2.5 MG tablet Take 1 tablet (2.5 mg total) by mouth daily.  90 tablet  1  . nitroGLYCERIN (NITROSTAT) 0.4 MG SL tablet Place 1 tablet (0.4 mg total) under the tongue  as needed. For chest pain- if helps, call 911  20 tablet  0  . oxyCODONE-acetaminophen (PERCOCET) 10-325 MG per tablet Take 1 tablet by mouth every 4 (four) hours as needed for pain (but not more than 5 per day).  150 tablet  0  . phenazopyridine (PYRIDIUM) 95 MG tablet Take 1 tablet (95 mg total) by mouth 3 (three) times daily as needed for pain.  10 tablet  0  . predniSONE (DELTASONE) 20 MG tablet Take 2 tablets (40 mg total) by mouth daily with breakfast.  14 tablet  0  . pregabalin (LYRICA) 25 MG capsule Take 3 capsules (75 mg total) by mouth 2 (two) times daily.  180 capsule  1  . promethazine (PHENERGAN) 25 MG tablet Take 1 tablet (25 mg total) by mouth daily as needed for nausea.  30 tablet  3  . sulfamethoxazole-trimethoprim (BACTRIM DS) 800-160 MG per tablet Take 1 tablet by mouth 2 (two) times daily.  14 tablet  0   No current facility-administered medications for this visit.    Objective: BP 115/71  Pulse 91  Temp(Src) 99.2 F (37.3 C) (Oral)  Ht $R'4\' 11"'ie$  (1.499 m)  Wt 137 lb (62.143 kg)  BMI 27.66 kg/m2 Gen: NAD, alert, cooperative with exam HEENT: NCAT, EOMI, PERRL CV: RRR, good S1/S2, no murmur Resp: CTABL, no wheezes, non-labored Abd: SNTND, BS present, no guarding or organomegaly Ext: Trace bilateral edema, right lower extremity with a small punctate lesion approximately 1 mm in diameter. Area of erythema and warmth approximately 4 cm x 6 cm. Tenderness to the touch on the right lower extremity. Neuro: Alert and oriented, No gross deficits   Assessment/Plan:  ATRIAL FIBRILLATION Rate controlled, regular today No anticoagulation due to fall risk per discussion with pt and family.   Leg wound, right Treated during recent hospitalization, no records yet, pt has requested Treated with IV antibiotics for unclear reasons without bacteremia, endocarditis, or osteo- pt denies these Continue IV ABx per hospitals plan Planning temp SNF placement for intensive PT and IV  antibiotics. Family to choose facility

## 2014-01-16 NOTE — Patient Instructions (Signed)
Great to see you !  Try out the nursing home for a few days or weeks and then follow uop in 1-2 weeks after that

## 2014-01-16 NOTE — Assessment & Plan Note (Signed)
Treated during recent hospitalization, no records yet, pt has requested Treated with IV antibiotics for unclear reasons without bacteremia, endocarditis, or osteo- pt denies these Continue IV ABx per hospitals plan Planning temp SNF placement for intensive PT and IV antibiotics. Family to choose facility

## 2014-02-24 ENCOUNTER — Telehealth: Payer: Self-pay | Admitting: Family Medicine

## 2014-02-24 NOTE — Telephone Encounter (Signed)
Pt will probably need an appt but will send to MD to verify. Sylvestre Rathgeber CMA

## 2014-02-24 NOTE — Telephone Encounter (Signed)
Patient need antibiotic for UTI.  Need to call into [facility] on [address] in [place].

## 2014-02-25 NOTE — Telephone Encounter (Signed)
Needs appt, cant Dx UTI over the phone.   Murtis Sink, MD [facility] Family Medicine Resident, PGY-3 02/25/2014, 8:40 AM

## 2014-02-25 NOTE — Telephone Encounter (Signed)
Pt does not feel she needs an appt. She has had UTI for 8 months. She has been antilbotic but it has come back after the antibotic has ended. Please advise

## 2014-02-26 NOTE — Telephone Encounter (Signed)
Spoke with patient and informed her that she HAS to come in before any antibiotic will be prescribed. Patient states she lives in [place] and its hard for her to get here. Again told patient that we need an actual urine sample to make sure we are treating her with the appropriate antibiotics and that we dont prescribe antibiotics without seeing the patient. Patient still declines appointment stating she shouldn't have to come in and asks to speak with MD. Will forward to PCP.

## 2014-02-27 NOTE — Telephone Encounter (Signed)
Called and explained I dont prescribe antibiotics over the phone. She is willing to come in for a SDA tomorrow.   She is having dysuria and foul smelling urine. She denies fever, change in appetite, and any other signs of UTI.   I also reviewed reasons to seek emergency care, including but not limited to persistent fever, PO intolerance, rapidly worsening.   Murtis Sink, MD [facility] Family Medicine Resident, PGY-3 02/27/2014, 12:25 PM

## 2014-03-11 ENCOUNTER — Telehealth: Payer: Self-pay | Admitting: Family Medicine

## 2014-03-11 MED ORDER — PROMETHAZINE HCL 25 MG PO TABS: 25.0000 mg | ORAL_TABLET | Freq: Every day | ORAL | Status: AC | PRN

## 2014-03-11 NOTE — Telephone Encounter (Signed)
Refill request for promethazine. Pt states that her pain MD was prescribing this but she is in the process of changing pain MD and old MD will not refill.

## 2014-03-11 NOTE — Telephone Encounter (Signed)
Unclear why she needs phenergan. She does not have a f/u scheduled. Will send small amount and staff to ask her to make appt.   Murtis Sink, MD [facility] Family Medicine Resident, PGY-3 03/11/2014, 3:53 PM

## 2014-03-12 NOTE — Telephone Encounter (Signed)
Left message on vm, informing of message from MD.

## 2014-03-19 ENCOUNTER — Encounter: Payer: Self-pay | Admitting: Clinical

## 2014-03-19 NOTE — Progress Notes (Signed)
Patient-Centered Care Plan from [facility] 10/29/13   Personal Goals (LTG):  "I been in pain so, I don't have any."  STG: Patient will be referred to home health for assessment of need of assistive devices such Commode chair and shower chair in 2 weeks. LTG: Patient will receive shower and commode chair in 1 month.    Motivation level: 5

## 2014-07-07 ENCOUNTER — Telehealth: Payer: Self-pay | Admitting: Family Medicine

## 2014-07-07 NOTE — Telephone Encounter (Signed)
Patients daughter states that mother is bedridden, cannot stand, and needs help with all ADL's. Daughter is unable to continue providing care and needs help with SNF placement options. Informed daughter that I would have our social worker giver her a call. Will forward to [facility]

## 2014-07-07 NOTE — Telephone Encounter (Signed)
Daughter called and would like to speak to the nurse or doctor about her mother. Myriam Jacobson

## 2014-07-07 NOTE — Telephone Encounter (Signed)
Called number provided, vm not set up so unable to leave message. Will try again later.

## 2014-07-07 NOTE — Telephone Encounter (Signed)
Daughter called again and wanted to speak to the nurse

## 2014-07-09 ENCOUNTER — Telehealth: Payer: Self-pay | Admitting: Clinical

## 2014-07-09 NOTE — Telephone Encounter (Signed)
CSW contacted pt regarding SNF placement. Daughter answered and states its too much on her and pts grandson. Pt is bed bound and needs 24/hr support at home. CSW spoke with pt who is agreeable to pt and states she would like to move into a SNF in either [place] or [place] next week.  CSW will complete an FL2 and do a SNF search. PCP made aware.  Theresia Bough, MSW, LCSW [phone]

## 2014-07-15 ENCOUNTER — Telehealth: Payer: Self-pay | Admitting: Clinical

## 2014-07-15 NOTE — Telephone Encounter (Signed)
CSW contacted pt on 07/14/14 and informed her that currently her bed offers are in [place]. Pt states she's not agreeable to placement in [place].   CSW to await other offers from facilities in [facility].  Theresia Bough, MSW, LCSW [phone]

## 2014-07-16 ENCOUNTER — Telehealth: Payer: Self-pay | Admitting: Clinical

## 2014-07-16 ENCOUNTER — Telehealth: Payer: Self-pay | Admitting: *Deleted

## 2014-07-16 NOTE — Telephone Encounter (Signed)
CSW contacted pt and informed her of a bed offer made by [facility]. Pt and daughter are agreeable to pursue placement on Monday 07/21/13. Kim with [facility] to contact daughter to discuss the admission process.  CSW will request FL2 to be signed by PCP.  Theresia Bough, MSW, LCSW [phone]

## 2014-07-16 NOTE — Telephone Encounter (Signed)
Spoke with pt and she is not sure if she had the flu shot at another facility.  She is going to check and see and if she has she will call to give [place] the date so we can update her info. If she has not we will need to schedule her a nurse visit to give her that. Lamonte Sakai, April D

## 2014-07-23 ENCOUNTER — Telehealth: Payer: Self-pay | Admitting: Clinical

## 2014-07-23 NOTE — Telephone Encounter (Signed)
CSW informed that pt will be admitted into [facility] tomorrow.   Theresia Bough, MSW, LCSW [phone]

## 2014-08-29 ENCOUNTER — Ambulatory Visit: Payer: Medicaid Other | Admitting: Family Medicine

## 2014-09-25 ENCOUNTER — Telehealth: Payer: Self-pay | Admitting: Family Medicine

## 2014-09-25 ENCOUNTER — Inpatient Hospital Stay: Payer: Medicaid Other | Admitting: Family Medicine

## 2014-09-25 NOTE — Telephone Encounter (Signed)
Received call from a [employer] [facility] facility regarding Beth [name]'s admission.  She will be seeing their provider while she is there.  They will contact us when she's discharged.

## 2014-09-25 NOTE — Telephone Encounter (Signed)
Sounds appropriate.   Murtis Sink, MD [facility] Family Medicine Resident, PGY-3 09/25/2014, 3:38 PM

## 2015-03-18 IMAGING — CR DG CHEST 2V
2 series · 2 of 2 positions shown · non-contrast
Comparison: Chest radiograph 03/18/2010

CLINICAL DATA: Hypertension and diabetes

CHEST - 2 VIEW

[w chest pa]
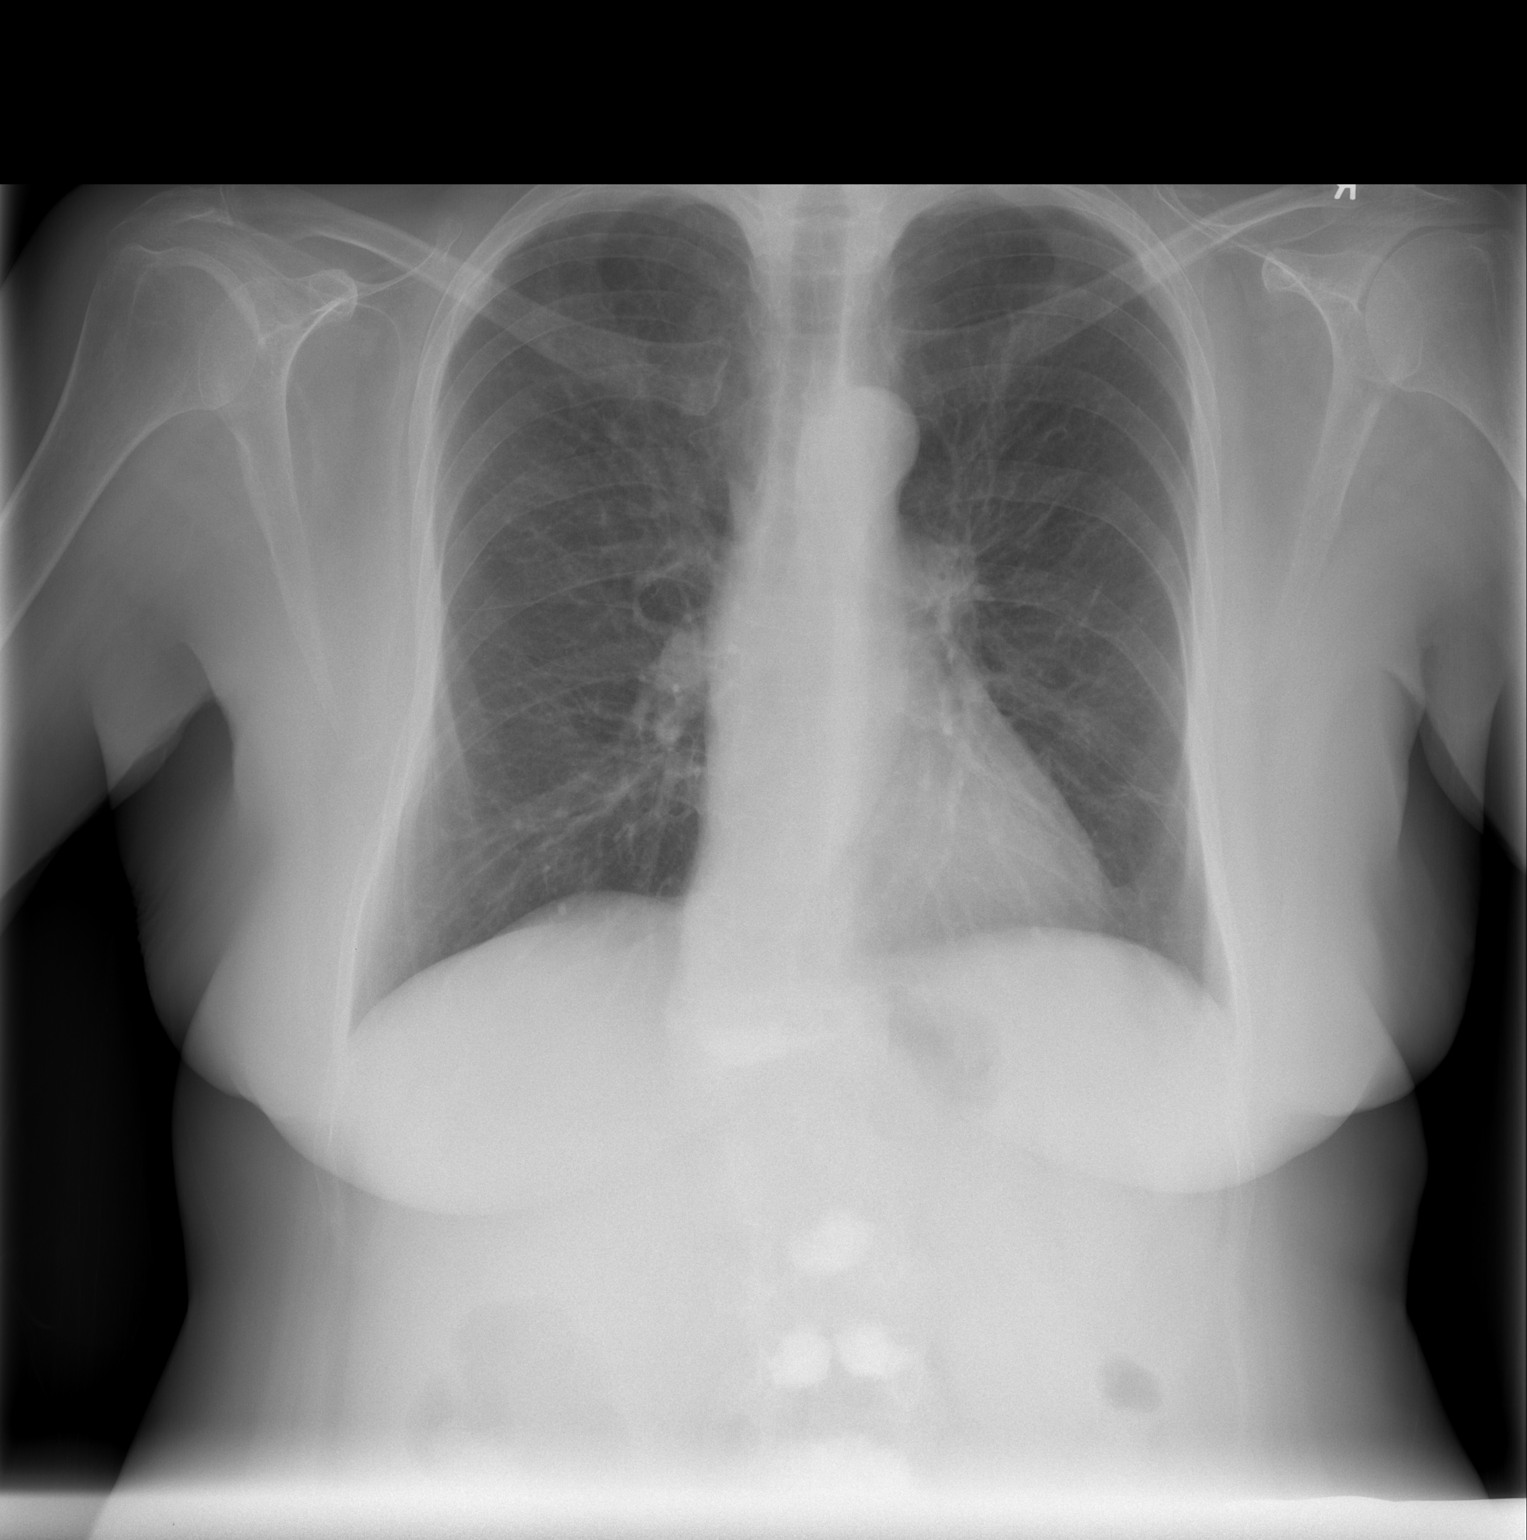

[w chest lat]
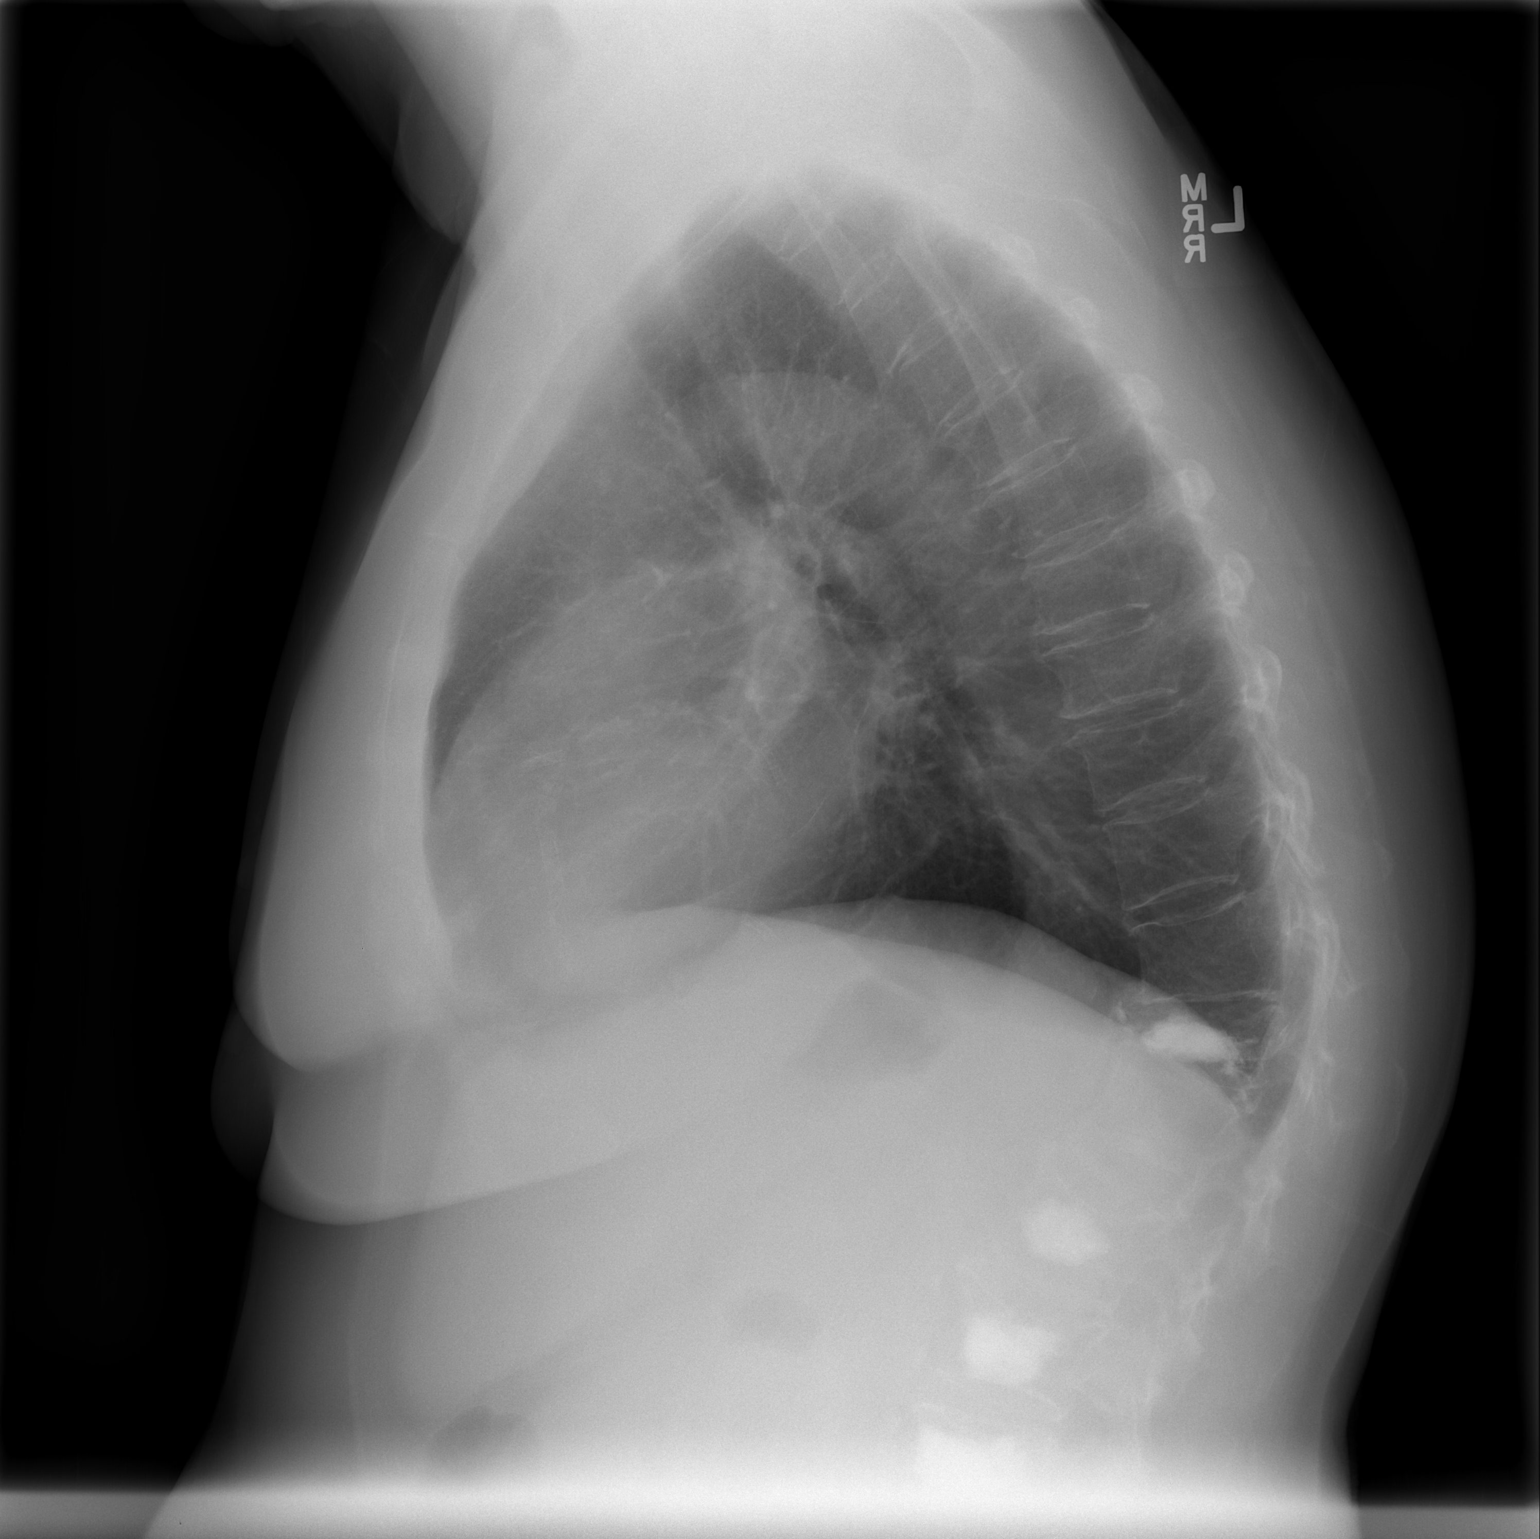

[2 of 2 positions shown; findings below may reference images not displayed]

FINDINGS: Normal mediastinum and cardiac silhouette.  Normal
pulmonary  vasculature.  No evidence of effusion, infiltrate, or
pneumothorax.  No acute bony abnormality.  Lungs are hyperinflated.
Augmentation cement noted within the thoracic and lumbar spine
compression fractures.  No new fracture.
IMPRESSION: Hyperinflated lungs.  No acute findings.

## 2016-08-15 ENCOUNTER — Telehealth: Payer: Self-pay | Admitting: Family Medicine

## 2016-08-15 NOTE — Telephone Encounter (Signed)
No answer/voicemail for patient or emergency contact. Patient has overdue HM (A1C, foot exam, flu vaccine etc.). Patient may still be in care facility as of 09/2014 but this has yet to be verified.
